# Patient Record
Sex: Female | Born: 1960 | Race: Black or African American | Hispanic: No | Marital: Single | Smoking: Never smoker
Health system: Southern US, Community
[De-identification: ages and names within clinical notes are randomized; demographics above are authoritative.]

## PROBLEM LIST (undated history)

## (undated) DIAGNOSIS — E119 Type 2 diabetes mellitus without complications: Secondary | ICD-10-CM

## (undated) DIAGNOSIS — E079 Disorder of thyroid, unspecified: Secondary | ICD-10-CM

## (undated) DIAGNOSIS — E785 Hyperlipidemia, unspecified: Secondary | ICD-10-CM

## (undated) DIAGNOSIS — I10 Essential (primary) hypertension: Secondary | ICD-10-CM

## (undated) DIAGNOSIS — G43909 Migraine, unspecified, not intractable, without status migrainosus: Secondary | ICD-10-CM

## (undated) HISTORY — DX: Essential (primary) hypertension: I10

## (undated) HISTORY — DX: Type 2 diabetes mellitus without complications: E11.9

## (undated) HISTORY — PX: ABDOMINAL HYSTERECTOMY: SHX81

## (undated) HISTORY — DX: Disorder of thyroid, unspecified: E07.9

## (undated) HISTORY — DX: Hyperlipidemia, unspecified: E78.5

## (undated) HISTORY — DX: Migraine, unspecified, not intractable, without status migrainosus: G43.909

---

## 2012-10-16 ENCOUNTER — Ambulatory Visit: Payer: Self-pay | Admitting: Internal Medicine

## 2012-12-03 ENCOUNTER — Other Ambulatory Visit: Payer: Self-pay | Admitting: Nephrology

## 2012-12-03 LAB — CBC WITH DIFFERENTIAL/PLATELET
Basophil #: 0 10*3/uL (ref 0.0–0.1)
Basophil %: 0.5 %
Eosinophil #: 0.2 10*3/uL (ref 0.0–0.7)
MCHC: 34.2 g/dL (ref 32.0–36.0)
MCV: 88 fL (ref 80–100)
Monocyte #: 0.5 x10 3/mm (ref 0.2–0.9)
Monocyte %: 7.8 %
Neutrophil %: 49.1 %
Platelet: 320 10*3/uL (ref 150–440)
RBC: 3.9 10*6/uL (ref 3.80–5.20)
RDW: 13.2 % (ref 11.5–14.5)
WBC: 6.2 10*3/uL (ref 3.6–11.0)

## 2012-12-03 LAB — COMPREHENSIVE METABOLIC PANEL
Albumin: 3.5 g/dL (ref 3.4–5.0)
Alkaline Phosphatase: 85 U/L (ref 50–136)
Calcium, Total: 9 mg/dL (ref 8.5–10.1)
Chloride: 104 mmol/L (ref 98–107)
Creatinine: 0.67 mg/dL (ref 0.60–1.30)
EGFR (African American): >60
Glucose: 103 mg/dL — ABNORMAL HIGH (ref 65–99)
SGOT(AST): 20 U/L (ref 15–37)
SGPT (ALT): 29 U/L (ref 12–78)
Sodium: 137 mmol/L (ref 136–145)
Total Protein: 7.6 g/dL (ref 6.4–8.2)

## 2012-12-03 LAB — URINALYSIS, COMPLETE
Bacteria: NONE SEEN
Bilirubin,UR: NEGATIVE
Blood: NEGATIVE
Glucose,UR: NEGATIVE mg/dL (ref 0–75)
Ketone: NEGATIVE
Squamous Epithelial: NONE SEEN
WBC UR: 4 /HPF (ref 0–5)

## 2012-12-03 LAB — PROTEIN / CREATININE RATIO, URINE
Creatinine, Urine: 70.7 mg/dL (ref 30.0–125.0)
Protein/Creat. Ratio: 495 mg/gCREAT — ABNORMAL HIGH (ref 0–200)

## 2012-12-03 LAB — PROTIME-INR: Prothrombin Time: 12.6 secs (ref 11.5–14.7)

## 2012-12-03 LAB — APTT: Activated PTT: 27.3 secs (ref 23.6–35.9)

## 2012-12-04 ENCOUNTER — Observation Stay: Payer: Self-pay | Admitting: Nephrology

## 2012-12-04 LAB — URINALYSIS, COMPLETE
Glucose,UR: NEGATIVE mg/dL (ref 0–75)
Ketone: NEGATIVE
Leukocyte Esterase: NEGATIVE
Nitrite: NEGATIVE
Ph: 7 (ref 4.5–8.0)
Protein: 100
Specific Gravity: 1.014 (ref 1.003–1.030)

## 2012-12-04 LAB — PROTEIN / CREATININE RATIO, URINE
Creatinine, Urine: 85.2 mg/dL (ref 30.0–125.0)
Protein/Creat. Ratio: 610 mg/gCREAT — ABNORMAL HIGH (ref 0–200)

## 2013-10-21 ENCOUNTER — Ambulatory Visit: Payer: Self-pay | Admitting: Nurse Practitioner

## 2015-06-08 ENCOUNTER — Other Ambulatory Visit: Payer: Self-pay | Admitting: Nurse Practitioner

## 2015-06-08 DIAGNOSIS — Z1231 Encounter for screening mammogram for malignant neoplasm of breast: Secondary | ICD-10-CM

## 2015-06-18 ENCOUNTER — Ambulatory Visit: Payer: Self-pay

## 2015-06-24 ENCOUNTER — Ambulatory Visit
Admission: RE | Admit: 2015-06-24 | Discharge: 2015-06-24 | Disposition: A | Discharge status: Home / Self Care | Source: Ambulatory Visit | Attending: Nurse Practitioner | Admitting: Nurse Practitioner

## 2015-06-24 DIAGNOSIS — Z1231 Encounter for screening mammogram for malignant neoplasm of breast: Secondary | ICD-10-CM | POA: Diagnosis not present

## 2015-07-01 ENCOUNTER — Other Ambulatory Visit: Payer: Self-pay | Admitting: Nurse Practitioner

## 2015-07-01 DIAGNOSIS — N63 Unspecified lump in unspecified breast: Secondary | ICD-10-CM

## 2015-07-02 ENCOUNTER — Ambulatory Visit
Admission: RE | Admit: 2015-07-02 | Discharge: 2015-07-02 | Disposition: A | Discharge status: Home / Self Care | Source: Ambulatory Visit | Attending: Nurse Practitioner | Admitting: Nurse Practitioner

## 2015-07-02 DIAGNOSIS — N63 Unspecified lump in unspecified breast: Secondary | ICD-10-CM

## 2016-04-21 ENCOUNTER — Other Ambulatory Visit: Payer: Self-pay | Admitting: Nurse Practitioner

## 2016-04-21 DIAGNOSIS — Z1231 Encounter for screening mammogram for malignant neoplasm of breast: Secondary | ICD-10-CM

## 2016-06-24 ENCOUNTER — Ambulatory Visit
Admission: RE | Admit: 2016-06-24 | Discharge: 2016-06-24 | Disposition: A | Discharge status: Home / Self Care | Payer: 59 | Source: Ambulatory Visit | Attending: Nurse Practitioner | Admitting: Nurse Practitioner

## 2016-06-24 DIAGNOSIS — Z1231 Encounter for screening mammogram for malignant neoplasm of breast: Secondary | ICD-10-CM | POA: Insufficient documentation

## 2017-05-30 ENCOUNTER — Other Ambulatory Visit: Payer: Self-pay | Admitting: Nurse Practitioner

## 2017-05-30 DIAGNOSIS — Z1231 Encounter for screening mammogram for malignant neoplasm of breast: Secondary | ICD-10-CM

## 2017-06-26 ENCOUNTER — Ambulatory Visit
Admission: RE | Admit: 2017-06-26 | Discharge: 2017-06-26 | Disposition: A | Discharge status: Home / Self Care | Payer: 59 | Source: Ambulatory Visit | Attending: Nurse Practitioner | Admitting: Nurse Practitioner

## 2017-06-26 DIAGNOSIS — Z1231 Encounter for screening mammogram for malignant neoplasm of breast: Secondary | ICD-10-CM | POA: Diagnosis present

## 2018-06-18 ENCOUNTER — Other Ambulatory Visit: Payer: Self-pay | Admitting: Nurse Practitioner

## 2018-06-18 DIAGNOSIS — Z1231 Encounter for screening mammogram for malignant neoplasm of breast: Secondary | ICD-10-CM

## 2018-06-29 ENCOUNTER — Other Ambulatory Visit: Payer: Self-pay

## 2018-06-29 ENCOUNTER — Ambulatory Visit
Admission: RE | Admit: 2018-06-29 | Discharge: 2018-06-29 | Disposition: A | Discharge status: Home / Self Care | Payer: 59 | Source: Ambulatory Visit | Attending: Nurse Practitioner | Admitting: Nurse Practitioner

## 2018-06-29 DIAGNOSIS — Z1231 Encounter for screening mammogram for malignant neoplasm of breast: Secondary | ICD-10-CM | POA: Diagnosis not present

## 2018-07-03 ENCOUNTER — Other Ambulatory Visit: Payer: Self-pay | Admitting: Nurse Practitioner

## 2018-07-03 DIAGNOSIS — R928 Other abnormal and inconclusive findings on diagnostic imaging of breast: Secondary | ICD-10-CM

## 2018-07-03 DIAGNOSIS — N6489 Other specified disorders of breast: Secondary | ICD-10-CM

## 2018-07-09 ENCOUNTER — Ambulatory Visit
Admission: RE | Admit: 2018-07-09 | Discharge: 2018-07-09 | Disposition: A | Discharge status: Home / Self Care | Payer: 59 | Source: Ambulatory Visit | Attending: Nurse Practitioner | Admitting: Nurse Practitioner

## 2018-07-09 ENCOUNTER — Other Ambulatory Visit: Payer: Self-pay

## 2018-07-09 DIAGNOSIS — R928 Other abnormal and inconclusive findings on diagnostic imaging of breast: Secondary | ICD-10-CM

## 2018-07-09 DIAGNOSIS — N6489 Other specified disorders of breast: Secondary | ICD-10-CM | POA: Diagnosis present

## 2019-02-11 ENCOUNTER — Other Ambulatory Visit: Payer: Self-pay

## 2019-02-11 DIAGNOSIS — Z20822 Contact with and (suspected) exposure to covid-19: Secondary | ICD-10-CM

## 2019-02-12 LAB — NOVEL CORONAVIRUS, NAA: SARS-CoV-2, NAA: NOT DETECTED

## 2019-04-01 ENCOUNTER — Ambulatory Visit: Admitting: *Deleted

## 2019-04-02 ENCOUNTER — Encounter: Payer: Self-pay | Admitting: *Deleted

## 2019-04-02 ENCOUNTER — Other Ambulatory Visit: Payer: Self-pay

## 2019-04-02 ENCOUNTER — Encounter: Payer: 59 | Attending: Nurse Practitioner | Admitting: *Deleted

## 2019-04-02 VITALS — BP 112/76 | Ht 66.0 in | Wt 174.1 lb

## 2019-04-02 DIAGNOSIS — Z713 Dietary counseling and surveillance: Secondary | ICD-10-CM | POA: Diagnosis not present

## 2019-04-02 DIAGNOSIS — E119 Type 2 diabetes mellitus without complications: Secondary | ICD-10-CM | POA: Insufficient documentation

## 2019-04-02 NOTE — Patient Instructions (Addendum)
Exercise: Continue walking/cardio for  30  minutes  2 days a week and gradually increase to 30 minutes 5 x week  Eat 3 meals day, 1-2 snacks a day Space meals 4-6 hours apart Allow 2-3 hours between meals and snacks Include 1 protein serving with meals  Return for classes on:

## 2019-04-03 ENCOUNTER — Encounter: Payer: Self-pay | Admitting: *Deleted

## 2019-04-03 NOTE — Progress Notes (Signed)
Diabetes Self-Management Education  Visit Type: First/Initial  Appt. Start Time: 1540 Appt. End Time: 1650  04/02/2019  Ms. Lauren Rodriguez, identified by name and date of birth, is a 58 y.o. female with a diagnosis of Diabetes: Type 2.   ASSESSMENT  Blood pressure 112/76, height 5\' 6"  (1.676 m), weight 174 lb 1.6 oz (79 kg). Body mass index is 28.1 kg/m.  Diabetes Self-Management Education - 04/02/19 1701      Visit Information   Visit Type  First/Initial      Initial Visit   Diabetes Type  Type 2    Are you currently following a meal plan?  Yes    What type of meal plan do you follow?  "no sodas or sugary drinks, no fried, limit pasta, don't eat after 7:30 pm"    Are you taking your medications as prescribed?  Yes    Date Diagnosed  2 years ago      Health Coping   How would you rate your overall health?  Good      Psychosocial Assessment   Patient Belief/Attitude about Diabetes  Motivated to manage diabetes   "sad, disappointed"   Self-care barriers  None    Self-management support  Doctor's office    Patient Concerns  Nutrition/Meal planning;Glycemic Control;Weight Control;Medication;Monitoring;Healthy Lifestyle    Special Needs  None    Preferred Learning Style  Hands on    Learning Readiness  Change in progress    How often do you need to have someone help you when you read instructions, pamphlets, or other written materials from your doctor or pharmacy?  1 - Never    What is the last grade level you completed in school?  15 years      Pre-Education Assessment   Patient understands the diabetes disease and treatment process.  Needs Instruction    Patient understands incorporating nutritional management into lifestyle.  Needs Instruction    Patient undertands incorporating physical activity into lifestyle.  Needs Instruction    Patient understands using medications safely.  Needs Instruction    Patient understands monitoring blood glucose, interpreting and using  results  Needs Instruction    Patient understands prevention, detection, and treatment of acute complications.  Needs Instruction    Patient understands prevention, detection, and treatment of chronic complications.  Needs Instruction    Patient understands how to develop strategies to address psychosocial issues.  Needs Instruction    Patient understands how to develop strategies to promote health/change behavior.  Needs Instruction      Complications   Last HgB A1C per patient/outside source  6.4 %   02/25/2019   How often do you check your blood sugar?  Patient declines    Have you had a dilated eye exam in the past 12 months?  Yes    Have you had a dental exam in the past 12 months?  Yes    Are you checking your feet?  Yes    How many days per week are you checking your feet?  7      Dietary Intake   Breakfast  wheat toast with cheese and avocado; smoothie 3 x week with spinach, kale, ginger, chia seeds, blueberries, coconut yogurt, water, almond milk; eggs, spinach and cheese; pancakes 1 x week    Snack (morning)  fruit (grapes, strawberries, blueberries, apple)    Lunch  spaghetti, smoothie with avocado; chicken and veggies; left overs    Snack (afternoon)  fruit (orange), peanut ubtter sandwich  Dinner  chicken, fish, [place]; sweet potatoes, green beans, peas, beans, quinoa, rice, pasta, salad with lettuce, onions, cukes, cabbage, red wine vinegar dressing    Beverage(s)  water, unsweetened green tea      Exercise   Exercise Type  Moderate (swimming / aerobic walking)    How many days per week to you exercise?  2    How many minutes per day do you exercise?  30    Total minutes per week of exercise  60      Patient Education   Previous Diabetes Education  No    Disease state   Definition of diabetes, type 1 and 2, and the diagnosis of diabetes;Factors that contribute to the development of diabetes    Nutrition management   Role of diet in the treatment of diabetes and the  relationship between the three main macronutrients and blood glucose level;Food label reading, portion sizes and measuring food.    Physical activity and exercise   Role of exercise on diabetes management, blood pressure control and cardiac health.    Medications  Reviewed patients medication for diabetes, action, purpose, timing of dose and side effects.    Monitoring  Interpreting lab values - A1C, lipid, urine microalbumina.    Chronic complications  Relationship between chronic complications and blood glucose control    Psychosocial adjustment  Identified and addressed patients feelings and concerns about diabetes      Individualized Goals (developed by patient)   Reducing Risk  Improve blood sugars Decrease medications Prevent diabetes complications Lose weight Lead a healthier lifestyle Become more fit     Outcomes   Expected Outcomes  Demonstrated interest in learning. Expect positive outcomes    Future DMSE  4-6 wks       Individualized Plan for Diabetes Self-Management Training:   Learning Objective:  Patient will have a greater understanding of diabetes self-management. Patient education plan is to attend individual and/or group sessions per assessed needs and concerns.   Plan:   Patient Instructions  Exercise: Continue walking/cardio for  30  minutes  2 days a week and gradually increase to 30 minutes 5 x week Eat 3 meals day, 1-2 snacks a day Space meals 4-6 hours apart Allow 2-3 hours between meals and snacks Include 1 protein serving with meals  Expected Outcomes:  Demonstrated interest in learning. Expect positive outcomes  Education material provided:  General Meal Planning Guidelines Simple Meal Plan  If problems or questions, patient to contact team via:  Lauren Rodriguez, [place], [place], CDE [phone]  Future DSME appointment: 4-6 wks  March 21, 2020 for Diabetes Class 1

## 2019-04-09 ENCOUNTER — Ambulatory Visit: Attending: Internal Medicine

## 2019-04-09 DIAGNOSIS — Z20822 Contact with and (suspected) exposure to covid-19: Secondary | ICD-10-CM

## 2019-04-11 LAB — NOVEL CORONAVIRUS, NAA: SARS-CoV-2, NAA: NOT DETECTED

## 2019-04-22 ENCOUNTER — Other Ambulatory Visit: Payer: Self-pay

## 2019-04-22 ENCOUNTER — Encounter: Payer: 59 | Attending: Nurse Practitioner | Admitting: Dietician

## 2019-04-22 ENCOUNTER — Encounter: Payer: Self-pay | Admitting: Dietician

## 2019-04-22 VITALS — Ht 66.0 in | Wt 175.5 lb

## 2019-04-22 DIAGNOSIS — Z713 Dietary counseling and surveillance: Secondary | ICD-10-CM | POA: Diagnosis present

## 2019-04-22 DIAGNOSIS — E119 Type 2 diabetes mellitus without complications: Secondary | ICD-10-CM | POA: Diagnosis not present

## 2019-04-22 NOTE — Progress Notes (Signed)

## 2019-04-29 ENCOUNTER — Other Ambulatory Visit: Payer: Self-pay

## 2019-04-29 ENCOUNTER — Encounter: Payer: 59 | Admitting: *Deleted

## 2019-04-29 ENCOUNTER — Encounter: Payer: Self-pay | Admitting: *Deleted

## 2019-04-29 VITALS — Wt 175.0 lb

## 2019-04-29 DIAGNOSIS — E119 Type 2 diabetes mellitus without complications: Secondary | ICD-10-CM

## 2019-04-29 DIAGNOSIS — Z713 Dietary counseling and surveillance: Secondary | ICD-10-CM | POA: Diagnosis not present

## 2019-04-29 NOTE — Progress Notes (Signed)

## 2019-05-06 ENCOUNTER — Ambulatory Visit

## 2019-05-06 ENCOUNTER — Telehealth: Payer: Self-pay | Admitting: *Deleted

## 2019-05-06 NOTE — Telephone Encounter (Signed)
Received voice mail that patient would not be able to come to Class 3 tonight. She is picking her [place] at the airport at 4 pm and would not be able to make it on time. Left a message that the next Class 3 would be Feb 15 on Mon night from 5:30-8:30 pm and to call and confirm.

## 2019-05-06 NOTE — Telephone Encounter (Signed)
Patient left voice mail to confirm make up date  for tonight's Class 3. Rescheduled for Feb 15.

## 2019-06-03 ENCOUNTER — Encounter: Payer: Self-pay | Admitting: *Deleted

## 2019-06-03 ENCOUNTER — Other Ambulatory Visit: Payer: Self-pay

## 2019-06-03 ENCOUNTER — Encounter: Payer: 59 | Attending: Nurse Practitioner | Admitting: *Deleted

## 2019-06-03 VITALS — BP 104/70 | Wt 172.9 lb

## 2019-06-03 DIAGNOSIS — E119 Type 2 diabetes mellitus without complications: Secondary | ICD-10-CM | POA: Insufficient documentation

## 2019-06-03 DIAGNOSIS — Z713 Dietary counseling and surveillance: Secondary | ICD-10-CM | POA: Insufficient documentation

## 2019-06-03 NOTE — Progress Notes (Signed)
Appt. Start Time: 1730 Appt. End Time: 2000 ? ?Class 3 ?Diabetes Overview - identify functions of pancreas and insulin; define insulin deficiency vs insulin resistance ? ?Medications - state name, dose, timing of currently prescribed medications; describe types of medications available for diabetes ? ?Psychosocial - identify DM as a source of stress; state the effects of stress on BG control; verbalize appropriate stress management techniques; identify personal stress issues  ? ?Nutritional Management - use food labels to identify serving size, content of carbohydrate, fiber, protein, fat, saturated fat and sodium; recognize food sources of fat, saturated fat, trans fat, and sodium, and verbalize goals for intake; describe healthful, appropriate food choices when dining out  ? ?Exercise - state a plan for personal exercise; verbalize contraindications for exercise ? ?Self-Monitoring - state importance of SMBG; use SMBG results to effectively manage diabetes; identify importance of regular HbA1C testing and goals for results ? ?Acute Complications - recognize hyperglycemia and hypoglycemia with causes and effects; identify blood glucose results as high, low or in control; list steps in treating and preventing high and low blood glucose ? ?Chronic Complications - state importance of daily self-foot exams; explain appropriate eye and dental care ? ?Lifestyle Changes/Goals & Health/Community Resources - set goals for proper diabetes care; state need for and frequency of healthcare follow-up; describe appropriate community resources for good health (ADA, web sites, apps)  ? ?Teaching Materials Used: ?Class 3 Slide Packet ?Diabetes Stress Test ?Stress Management Tools ?Stress Poem ?Goal Setting Worksheet ?Website/App List ? ?  ?

## 2019-06-04 ENCOUNTER — Encounter: Payer: Self-pay | Admitting: *Deleted

## 2019-06-28 ENCOUNTER — Other Ambulatory Visit: Payer: Self-pay | Admitting: Nurse Practitioner

## 2019-06-28 DIAGNOSIS — Z1231 Encounter for screening mammogram for malignant neoplasm of breast: Secondary | ICD-10-CM

## 2019-07-12 ENCOUNTER — Ambulatory Visit

## 2019-07-16 ENCOUNTER — Ambulatory Visit
Admission: RE | Admit: 2019-07-16 | Discharge: 2019-07-16 | Disposition: A | Discharge status: Home / Self Care | Payer: 59 | Source: Ambulatory Visit | Attending: Nurse Practitioner | Admitting: Nurse Practitioner

## 2019-07-16 DIAGNOSIS — Z1231 Encounter for screening mammogram for malignant neoplasm of breast: Secondary | ICD-10-CM | POA: Diagnosis not present

## 2019-07-18 ENCOUNTER — Ambulatory Visit: Attending: Internal Medicine

## 2019-07-18 DIAGNOSIS — Z23 Encounter for immunization: Secondary | ICD-10-CM

## 2019-07-18 NOTE — Progress Notes (Signed)
   Covid-19 Vaccination Clinic  Name:  Lauren Rodriguez    MRN: [id number] DOB: 17-Jun-1960  07/18/2019  Lauren Rodriguez was observed post Covid-19 immunization for 15 minutes without incident. She was provided with Vaccine Information Sheet and instruction to access the V-Safe system.   Lauren Rodriguez was instructed to call 911 with any severe reactions post vaccine: Marland Kitchen Difficulty breathing  . Swelling of face and throat  . A fast heartbeat  . A bad rash all over body  . Dizziness and weakness   Immunizations Administered    Name Date Dose VIS Date Route   Pfizer COVID-19 Vaccine 07/18/2019  8:22 AM 0.3 mL 03/29/2019 Intramuscular   Manufacturer: [employer], [employer]   Lot: [id number]   NDC: [address]-5

## 2019-07-22 ENCOUNTER — Other Ambulatory Visit: Payer: Self-pay | Admitting: Nurse Practitioner

## 2019-07-22 DIAGNOSIS — R928 Other abnormal and inconclusive findings on diagnostic imaging of breast: Secondary | ICD-10-CM

## 2019-07-22 DIAGNOSIS — N6489 Other specified disorders of breast: Secondary | ICD-10-CM

## 2019-08-09 ENCOUNTER — Ambulatory Visit
Admission: RE | Admit: 2019-08-09 | Discharge: 2019-08-09 | Disposition: A | Discharge status: Home / Self Care | Payer: 59 | Source: Ambulatory Visit | Attending: Nurse Practitioner | Admitting: Nurse Practitioner

## 2019-08-09 DIAGNOSIS — N6489 Other specified disorders of breast: Secondary | ICD-10-CM

## 2019-08-09 DIAGNOSIS — R928 Other abnormal and inconclusive findings on diagnostic imaging of breast: Secondary | ICD-10-CM | POA: Insufficient documentation

## 2019-08-12 ENCOUNTER — Ambulatory Visit: Admitting: Dermatology

## 2019-08-13 ENCOUNTER — Ambulatory Visit: Attending: Internal Medicine

## 2019-08-13 DIAGNOSIS — Z23 Encounter for immunization: Secondary | ICD-10-CM

## 2019-08-13 NOTE — Progress Notes (Signed)
   Covid-19 Vaccination Clinic  Name:  Lauren Rodriguez    MRN: [id number] DOB: May 14, 1960  08/13/2019  Lauren Rodriguez was observed post Covid-19 immunization for 15 minutes without incident. She was provided with Vaccine Information Sheet and instruction to access the V-Safe system.   Lauren Rodriguez was instructed to call 911 with any severe reactions post vaccine: Marland Kitchen Difficulty breathing  . Swelling of face and throat  . A fast heartbeat  . A bad rash all over body  . Dizziness and weakness   Immunizations Administered    Name Date Dose VIS Date Route   Pfizer COVID-19 Vaccine 08/13/2019  8:44 AM 0.3 mL 06/12/2018 Intramuscular   Manufacturer: [employer], [employer]   Lot: [id number]   NDC: [address]-8

## 2020-09-29 ENCOUNTER — Other Ambulatory Visit: Payer: Self-pay | Admitting: Nurse Practitioner

## 2020-09-29 DIAGNOSIS — Z1231 Encounter for screening mammogram for malignant neoplasm of breast: Secondary | ICD-10-CM

## 2020-10-06 ENCOUNTER — Ambulatory Visit
Admission: RE | Admit: 2020-10-06 | Discharge: 2020-10-06 | Disposition: A | Discharge status: Home / Self Care | Payer: Managed Care, Other (non HMO) | Source: Ambulatory Visit | Attending: Nurse Practitioner | Admitting: Nurse Practitioner

## 2020-10-06 ENCOUNTER — Other Ambulatory Visit: Payer: Self-pay

## 2020-10-06 DIAGNOSIS — Z1231 Encounter for screening mammogram for malignant neoplasm of breast: Secondary | ICD-10-CM | POA: Diagnosis present

## 2021-07-14 ENCOUNTER — Ambulatory Visit (INDEPENDENT_AMBULATORY_CARE_PROVIDER_SITE_OTHER): Payer: Managed Care, Other (non HMO) | Admitting: Psychiatry

## 2021-07-14 ENCOUNTER — Encounter: Payer: Self-pay | Admitting: Psychiatry

## 2021-07-14 VITALS — BP 118/76 | HR 74 | Ht 66.0 in | Wt 172.0 lb

## 2021-07-14 DIAGNOSIS — R202 Paresthesia of skin: Secondary | ICD-10-CM | POA: Diagnosis not present

## 2021-07-14 DIAGNOSIS — R200 Anesthesia of skin: Secondary | ICD-10-CM | POA: Diagnosis not present

## 2021-07-14 DIAGNOSIS — G43019 Migraine without aura, intractable, without status migrainosus: Secondary | ICD-10-CM | POA: Diagnosis not present

## 2021-07-14 MED ORDER — EMGALITY 120 MG/ML ~~LOC~~ SOAJ: 1.0000 "pen " | SUBCUTANEOUS | 3 refills | Status: DC

## 2021-07-14 MED ORDER — EMGALITY 120 MG/ML ~~LOC~~ SOAJ: 2.0000 "pen " | Freq: Once | SUBCUTANEOUS | 0 refills | Status: AC

## 2021-07-14 MED ORDER — UBRELVY 100 MG PO TABS: 100.0000 mg | ORAL_TABLET | ORAL | 3 refills | Status: DC | PRN

## 2021-07-14 MED ORDER — UBRELVY 100 MG PO TABS: 100.0000 mg | ORAL_TABLET | ORAL | 0 refills | Status: DC | PRN

## 2021-07-14 NOTE — Progress Notes (Signed)
? ?Referring:  ?Danelle Berry, NP ?[facility] ?[place],  [place] [address] ? ?PCP: ?Danelle Berry, NP ? ?Neurology was asked to evaluate Lauren Rodriguez, a 61 year old female for a chief complaint of headaches.  Our recommendations of care will be communicated by shared medical record.   ? ?CC:  headaches ? ?History provided from self ? ?HPI:  ?Medical co-morbidities: DM, hypothyroidism, proteinuria ? ?The patient presents for evaluation of migraines which began 20 years ago when she was in the [employer]. She has a lower level headache every day, but has more severe migraines 3 times per month. Migraine can last for 3 days at a time. They are described as pounding pain with associated photophobia, phonophobia, and nausea. She also endorses significant neck pain. ? ?Her migraines were previously well-controlled with Emgality for prevention and Ubrelvy for rescue. Insurance initially covered this, but has now stopped covering the medications. She has tried taking Ubrelvy samples while off of Emgality, which was not effective.  ? ?Headache History: ?Onset: 20 years ago ?Triggers: strong smells, stress, bright lights ?Aura: none ?Location: varies ?Quality/Description: pounding ?Associated Symptoms: ? Photophobia: yes ? Phonophobia: yes ? Nausea: yes ?Worse with activity?: yes ?Duration of headaches: up to 3 days ? ?Migraine days per month: 12 ?Migraine free days per month: 18 ? ?Current Treatment: ?Abortive ?Ubrelvy 50 mg (samples) ? ?Preventative ?none ? ?Prior Therapies                                 ?Ubrelvy 100 mg PRN ?Citalopram  ?Propranolol ?amitriptyline ?Lisinopril 5 mg daily ?Fioricet ?Emgality 120 mg monthly ?Imitrex ?Maxalt ?Zomig ?Trigger point injections ? ?LABS: ?CBC ?   ?Component Value Date/Time  ? WBC 6.2 12/03/2012 1123  ? RBC 3.90 12/03/2012 1123  ? HGB 11.2 (L) 12/04/2012 1612  ? HCT 34.3 (L) 12/03/2012 1123  ? PLT 320 12/03/2012 1123  ? MCV 88 12/03/2012 1123  ? MCH 30.0 12/03/2012 1123   ? MCHC 34.2 12/03/2012 1123  ? RDW 13.2 12/03/2012 1123  ? LYMPHSABS 2.4 12/03/2012 1123  ? MONOABS 0.5 12/03/2012 1123  ? EOSABS 0.2 12/03/2012 1123  ? BASOSABS 0.0 12/03/2012 1123  ? ? ?  Latest Ref Rng & Units 12/03/2012  ? 11:23 AM  ?CMP  ?Glucose 65 - 99 mg/dL 103    ?BUN 7 - 18 mg/dL 16    ?Creatinine 0.60 - 1.30 mg/dL 0.67    ?Sodium 136 - 145 mmol/L 137    ?Potassium 3.5 - 5.1 mmol/L 4.0    ?Chloride 98 - 107 mmol/L 104    ?CO2 21 - 32 mmol/L 31    ?Calcium 8.5 - 10.1 mg/dL 9.0    ?Total Protein 6.4 - 8.2 g/dL 7.6    ?Total Bilirubin 0.2 - 1.0 mg/dL 0.3    ?Alkaline Phos 50 - 136 Unit/L 85    ?AST 15 - 37 Unit/L 20    ?ALT 12 - 78 U/L 29    ? ? ? ?IMAGING:  ?none ? ?Current Outpatient Medications on File Prior to Visit  ?Medication Sig Dispense Refill  ? butalbital-acetaminophen-caffeine (FIORICET) 50-325-40 MG tablet Take 1 tablet by mouth every 8 (eight) hours as needed.    ? citalopram (CELEXA) 10 MG tablet Take 10 mg by mouth daily.    ? empagliflozin (JARDIANCE) 25 MG TABS tablet Take 25 mg by mouth daily.    ? HYDROcodone-acetaminophen (NORCO/VICODIN) 5-325 MG  tablet Take one tablet at night for pain; may take up to every 6 hours as needed for pain if not working or driving    ? levothyroxine (SYNTHROID) 100 MCG tablet Take 100 mcg by mouth every morning.    ? lisinopril (ZESTRIL) 5 MG tablet Take 5 mg by mouth daily.    ? naproxen (NAPROSYN) 500 MG tablet Take 500 mg by mouth 2 (two) times daily.    ? pantoprazole (PROTONIX) 40 MG tablet Take 40 mg by mouth daily.    ? rosuvastatin (CRESTOR) 40 MG tablet Take 40 mg by mouth daily.    ? ?No current facility-administered medications on file prior to visit.  ? ? ? ?Allergies: ?Allergies  ?Allergen Reactions  ? Other Hives and Swelling  ?  Chocolate flavoring  ? ? ?Family History: ?Migraine or other headaches in the family:  no ?Aneurysms in a first degree relative:  no ?Brain tumors in the family:  no ?Other neurological illness in the family:    no ? ?Past Medical History: ?Past Medical History:  ?Diagnosis Date  ? Diabetes mellitus without complication ([place])   ? Hyperlipidemia   ? Hypertension   ? Migraines   ? Thyroid disease   ? ? ?Past Surgical History ?Past Surgical History:  ?Procedure Laterality Date  ? ABDOMINAL HYSTERECTOMY    ? ? ?Social History: ?Social History  ? ?Tobacco Use  ? Smoking status: Never  ? Smokeless tobacco: Never  ?Substance Use Topics  ? Alcohol use: Yes  ?  Alcohol/week: 2.0 standard drinks  ?  Types: 2 Glasses of wine per week  ? ? ?ROS: ?Negative for fevers, chills. Positive for migraines. All other systems reviewed and negative unless stated otherwise in HPI. ? ? ?Physical Exam:  ? ?Vital Signs: ?BP 118/76   Pulse 74   Ht 5\' 6"  (1.676 m)   Wt 172 lb (78 kg)   BMI 27.76 kg/m?  ?GENERAL: well appearing,in no acute distress,alert ?SKIN:  Color, texture, turgor normal. No rashes or lesions ?HEAD:  Normocephalic/atraumatic. ?CV:  RRR ?RESP: Normal respiratory effort ?MSK: +mild tenderness to palpation over bilateral occiput, neck, and shoulders ? ?NEUROLOGICAL: ?Mental Status: Alert, oriented to person, place and time,Follows commands ?Cranial Nerves: PERRL, visual fields intact to confrontation, extraocular movements intact, decreased sensation over right V1, asymmetric smile (right corner of mouth lower than left--patient does report she is wearing a new retainer), hearing grossly intact, no dysarthria ?Motor: muscle strength 5/5 both upper and lower extremities,no drift, normal tone ?Reflexes: 2+ throughout ?Sensation: intact to light touch all 4 extremities ?Coordination: Finger-to- nose-finger intact bilaterally ?Gait: normal-based ? ? ?IMPRESSION: ?61 year old female with a history of DM, hypothyroidism, proteinuria who presents for evaluation of migraines. Will order MRI brain as her exam is significant for asymmetric smile and decreased sensation over right V1.Migraines were previously controlled with Emgality for  prevention and Ubrelvy for rescue. She has failed multiple oral medications previously including triptans, beta blockers, anti-depressants, and anti-hypertensives. Will restart Emgality and Ubrelvy. Provided website links to savings programs if we are unable to obtain insurance coverage. ? ?PLAN: ?-MRI brain without contrast (hx kidney disease with proteinuria) ?-Prevention: Restart Emgality 120 mg monthly ?-Rescue: Restart Ubrelvy 100 mg PRN ? ?I spent a total of 33 minutes chart reviewing and counseling the patient. Headache education was done. Discussed treatment options including preventive and acute medications. Discussed medication side effects, adverse reactions and drug interactions. Written educational materials and patient instructions outlining all of the  above were given. ? ?Follow-up: 4 months ? ? ?Genia Harold, MD ?07/14/2021   ?12:02 PM ? ? ?

## 2021-07-14 NOTE — Patient Instructions (Addendum)
Websites for savings cards: ?[email or web address] ?[email or web address] ? ?MRI of the brain ?

## 2021-07-20 ENCOUNTER — Telehealth: Payer: Self-pay | Admitting: Psychiatry

## 2021-07-20 NOTE — Telephone Encounter (Signed)
Cigna/Tricare order sent to GI, no auth req per Gwinda Passe at [facility] for [facility]. They will obtain the auth for Cigna and reach out to the patient to schedule.  ?

## 2021-07-22 ENCOUNTER — Ambulatory Visit
Admission: RE | Admit: 2021-07-22 | Discharge: 2021-07-22 | Disposition: A | Discharge status: Home / Self Care | Source: Ambulatory Visit | Attending: Psychiatry | Admitting: Psychiatry

## 2021-07-22 DIAGNOSIS — R200 Anesthesia of skin: Secondary | ICD-10-CM | POA: Diagnosis not present

## 2021-07-22 DIAGNOSIS — R202 Paresthesia of skin: Secondary | ICD-10-CM

## 2021-07-28 ENCOUNTER — Encounter: Payer: Self-pay | Admitting: *Deleted

## 2021-07-28 ENCOUNTER — Telehealth: Payer: Self-pay | Admitting: *Deleted

## 2021-07-28 ENCOUNTER — Encounter: Payer: Self-pay | Admitting: Psychiatry

## 2021-07-28 NOTE — Telephone Encounter (Signed)
New PA started on CMM, Your information has been sent to [facility]. ?

## 2021-07-28 NOTE — Telephone Encounter (Signed)
Ubrelvy 100 mg PA, key [id number],  G43.019. ?Patient not found. [employer] customer service #, spoke with Diane who stated she doesn't have pharmacy benefit with [place]. She has pharmacy benefits with [facility], [phone]. She transferred call, spoke with Lucendia Herrlich and answered questions. She stated it qualifies for EPA through cover my meds, key [id number].  ? ?

## 2021-07-28 NOTE — Telephone Encounter (Signed)
Bernita Raisin, Case number: [id number] ?You can now fill your prescription for this medication. ?VALID 07/28/2021 - 07/29/2022 ?Sent my chart to inform patient. ?

## 2021-12-09 ENCOUNTER — Ambulatory Visit: Payer: Managed Care, Other (non HMO) | Admitting: Psychiatry

## 2021-12-09 ENCOUNTER — Other Ambulatory Visit: Payer: Self-pay | Admitting: Nurse Practitioner

## 2021-12-09 DIAGNOSIS — Z1231 Encounter for screening mammogram for malignant neoplasm of breast: Secondary | ICD-10-CM

## 2022-01-03 ENCOUNTER — Ambulatory Visit: Payer: Managed Care, Other (non HMO) | Admitting: Psychiatry

## 2022-01-03 VITALS — BP 113/68 | HR 79 | Ht 66.0 in | Wt 175.0 lb

## 2022-01-03 DIAGNOSIS — G43019 Migraine without aura, intractable, without status migrainosus: Secondary | ICD-10-CM | POA: Diagnosis not present

## 2022-01-03 MED ORDER — UBRELVY 100 MG PO TABS: 100.0000 mg | ORAL_TABLET | ORAL | 11 refills | Status: DC | PRN

## 2022-01-03 MED ORDER — EMGALITY 120 MG/ML ~~LOC~~ SOAJ: 1.0000 "pen " | SUBCUTANEOUS | 11 refills | Status: DC

## 2022-01-03 NOTE — Progress Notes (Signed)
   CC:  headaches  Follow-up Visit  Last visit: 07/14/21  Brief HPI: 61 year old female with a history of DM, hypothyroidism, proteinuria who follows in clinic for migraines.  At her last visit, brain MRI was ordered. She was started on Emgality for prevention and Ubrelvy for rescue.  Interval History: Headaches have improved since starting [place]. She is down to one migraine per month. Has not had any side effects since restarting the medications.  Brain MRI 07/22/21 showed chronic microvascular changes and was otherwise unremarkable  Headache days per month: 1 Headache free days per month: 29  Current Headache Regimen: Preventative: Emgality 120 mg monthly Abortive: Ubrelvy 100 mg PRN   Prior Therapies                                  Prevention: Citalopram  Propranolol amitriptyline Lisinopril 5 mg daily Emgality 120 mg monthly Trigger point injections  Rescue: Imitrex Maxalt Zomig Ubrelvy 100 mg PRN Fioricet   Physical Exam:   Vital Signs: BP 113/68   Pulse 79   Ht 5\' 6"  (1.676 m)   Wt 175 lb (79.4 kg)   BMI 28.25 kg/m  GENERAL:  well appearing, in no acute distress, alert  SKIN:  Color, texture, turgor normal. No rashes or lesions HEAD:  Normocephalic/atraumatic. RESP: normal respiratory effort MSK:  No gross joint deformities.   NEUROLOGICAL: Mental Status: Alert, oriented to person, place and time, Follows commands, and Speech fluent and appropriate. Cranial Nerves: PERRL, face symmetric, no dysarthria, hearing grossly intact Motor: moves all extremities equally Gait: normal-based.  IMPRESSION: 61 year old female with a history of DM, hypothyroidism, proteinuria who presents for follow up of migraines. MRI brain with chronic microvascular changes, more than typical for her age. Discussed importance of maintaining good control of her vascular risk factors. She has had significant improvement in her migraines since restarting Emgality  for prevention and Ubrelvy for rescue. Will continue current medication regimen for now.  PLAN: -Prevention: Continue Emgality 120 mg monthly -Rescue: Continue Ubrelvy 100 mg PRN   Follow-up: 7 months  I spent a total of 15 minutes on the date of the service. Headache education was done. Discussed treatment options including preventive and acute medications.  Discussed medication side effects, adverse reactions and drug interactions. Written educational materials and patient instructions outlining all of the above were given.  Genia Harold, MD 01/03/22 3:30 PM

## 2022-01-06 ENCOUNTER — Ambulatory Visit
Admission: RE | Admit: 2022-01-06 | Discharge: 2022-01-06 | Disposition: A | Discharge status: Home / Self Care | Payer: Managed Care, Other (non HMO) | Source: Ambulatory Visit | Attending: Nurse Practitioner | Admitting: Nurse Practitioner

## 2022-01-06 DIAGNOSIS — Z1231 Encounter for screening mammogram for malignant neoplasm of breast: Secondary | ICD-10-CM | POA: Diagnosis present

## 2022-05-03 ENCOUNTER — Ambulatory Visit: Payer: Managed Care, Other (non HMO) | Admitting: Psychiatry

## 2022-07-17 ENCOUNTER — Other Ambulatory Visit: Payer: Self-pay | Admitting: Nurse Practitioner

## 2022-07-20 NOTE — Progress Notes (Unsigned)
   CC:  headaches  Follow-up Visit  Last visit: 01/03/22  Brief HPI: 62 year old female with a history of DM, hypothyroidism, proteinuria who follows in clinic for migraines. Brain MRI 07/22/21 showed chronic microvascular changes and was otherwise unremarkable.   At her last visit she was continued on Emgality for migraine prevention and Ubrelvy for rescue. Interval History: ***   Headache days per month: *** Migraine days per month*** Headache free days per month: ***  Current Headache Regimen: Preventative: Emgality 120 mg monthly Abortive: Ubrelvy 100 mg PRN   Prior Therapies                                  Prevention: Citalopram  Propranolol amitriptyline Lisinopril 5 mg daily Emgality 120 mg monthly Trigger point injections   Rescue: Imitrex Maxalt Zomig Ubrelvy 100 mg PRN Fioricet  Physical Exam:   Vital Signs: There were no vitals taken for this visit. GENERAL:  well appearing, in no acute distress, alert  SKIN:  Color, texture, turgor normal. No rashes or lesions HEAD:  Normocephalic/atraumatic. RESP: normal respiratory effort MSK:  No gross joint deformities.   NEUROLOGICAL: Mental Status: Alert, oriented to person, place and time, Follows commands, and Speech fluent and appropriate. Cranial Nerves: PERRL, face symmetric, no dysarthria, hearing grossly intact Motor: moves all extremities equally Gait: normal-based.  IMPRESSION: ***  PLAN: ***   Follow-up: ***  I spent a total of *** minutes on the date of the service. Headache education was done. Discussed lifestyle modification including increased oral hydration, decreased caffeine, exercise and stress management. Discussed treatment options including preventive and acute medications, natural supplements, and infusion therapy. Discussed medication overuse headache and to limit use of acute treatments to no more than 2 days/week or 10 days/month. Discussed medication side effects, adverse  reactions and drug interactions. Written educational materials and patient instructions outlining all of the above were given.  Genia Harold, MD

## 2022-07-21 ENCOUNTER — Ambulatory Visit (INDEPENDENT_AMBULATORY_CARE_PROVIDER_SITE_OTHER): Payer: Managed Care, Other (non HMO) | Admitting: Psychiatry

## 2022-07-21 ENCOUNTER — Encounter: Payer: Self-pay | Admitting: Psychiatry

## 2022-07-21 VITALS — BP 125/76 | HR 90 | Ht 66.0 in | Wt 173.0 lb

## 2022-07-21 DIAGNOSIS — G43019 Migraine without aura, intractable, without status migrainosus: Secondary | ICD-10-CM | POA: Diagnosis not present

## 2022-07-21 MED ORDER — EMGALITY 120 MG/ML ~~LOC~~ SOAJ: 1.0000 "pen " | SUBCUTANEOUS | 11 refills | Status: DC

## 2022-07-21 MED ORDER — KETOROLAC TROMETHAMINE 60 MG/2ML IM SOLN
60.0000 mg | Freq: Once | INTRAMUSCULAR | Status: AC
  Administered 2022-07-21: 60 mg via INTRAMUSCULAR

## 2022-07-21 NOTE — Patient Instructions (Addendum)
See if you are eligible for an Emgality savings card at [email or web address]  If you do not qualify for a saving card, you may be eligible for financial assistance through [place] program: [email or web address]://[email or web address]/how-to-apply#choose-application

## 2022-08-08 ENCOUNTER — Ambulatory Visit: Payer: Managed Care, Other (non HMO) | Admitting: Psychiatry

## 2022-08-16 ENCOUNTER — Other Ambulatory Visit: Payer: Self-pay | Admitting: Nurse Practitioner

## 2022-08-30 ENCOUNTER — Other Ambulatory Visit: Payer: Self-pay | Admitting: Nurse Practitioner

## 2022-08-31 LAB — HGB A1C W/O EAG: Hgb A1c MFr Bld: 6.5 % — ABNORMAL HIGH (ref 4.8–5.6)

## 2022-08-31 LAB — LIPID PANEL W/O CHOL/HDL RATIO
Cholesterol, Total: 237 mg/dL — ABNORMAL HIGH (ref 100–199)
HDL: 67 mg/dL (ref >39)
LDL Chol Calc (NIH): 157 mg/dL — ABNORMAL HIGH (ref 0–99)
Triglycerides: 75 mg/dL (ref 0–149)
VLDL Cholesterol Cal: 13 mg/dL (ref 5–40)

## 2022-08-31 LAB — COMPREHENSIVE METABOLIC PANEL
ALT: 12 IU/L (ref 0–32)
AST: 16 IU/L (ref 0–40)
Albumin/Globulin Ratio: 1.5 (ref 1.2–2.2)
Albumin: 4 g/dL (ref 3.8–4.9)
Alkaline Phosphatase: 85 IU/L (ref 44–121)
BUN/Creatinine Ratio: 28 (ref 12–28)
BUN: 19 mg/dL (ref 8–27)
Bilirubin Total: 0.3 mg/dL (ref 0.0–1.2)
CO2: 22 mmol/L (ref 20–29)
Calcium: 9.8 mg/dL (ref 8.7–10.3)
Chloride: 102 mmol/L (ref 96–106)
Creatinine, Ser: 0.67 mg/dL (ref 0.57–1.00)
Globulin, Total: 2.7 g/dL (ref 1.5–4.5)
Glucose: 104 mg/dL — ABNORMAL HIGH (ref 70–99)
Potassium: 4.3 mmol/L (ref 3.5–5.2)
Sodium: 140 mmol/L (ref 134–144)
Total Protein: 6.7 g/dL (ref 6.0–8.5)
eGFR: 100 mL/min/{1.73_m2} (ref >59)

## 2022-08-31 LAB — TSH: TSH: 4.63 u[IU]/mL — ABNORMAL HIGH (ref 0.450–4.500)

## 2022-09-02 ENCOUNTER — Ambulatory Visit: Payer: Managed Care, Other (non HMO) | Admitting: Nurse Practitioner

## 2022-09-02 ENCOUNTER — Other Ambulatory Visit (HOSPITAL_COMMUNITY): Payer: Self-pay

## 2022-09-06 ENCOUNTER — Ambulatory Visit: Payer: Managed Care, Other (non HMO) | Admitting: Nurse Practitioner

## 2022-09-09 ENCOUNTER — Telehealth: Payer: Self-pay

## 2022-09-09 ENCOUNTER — Other Ambulatory Visit (HOSPITAL_COMMUNITY): Payer: Self-pay

## 2022-09-09 NOTE — Telephone Encounter (Signed)
Patient Advocate Encounter   Received notification from [facility] that prior authorization is required for Ubrelvy 100MG  tablets   Submitted: 09-09-2022 Key [id number]  Status is pending

## 2022-09-11 ENCOUNTER — Telehealth: Payer: Self-pay

## 2022-09-11 ENCOUNTER — Other Ambulatory Visit (HOSPITAL_COMMUNITY): Payer: Self-pay

## 2022-09-11 DIAGNOSIS — G43019 Migraine without aura, intractable, without status migrainosus: Secondary | ICD-10-CM

## 2022-09-11 NOTE — Telephone Encounter (Signed)
Patient Advocate Encounter   Received notification from [facility] that prior authorization is required for Emgality 120MG /ML auto-injectors (migraine)   Submitted: 09-11-2022 Key [id number]  Status is pending

## 2022-09-12 ENCOUNTER — Other Ambulatory Visit (HOSPITAL_COMMUNITY): Payer: Self-pay

## 2022-09-12 NOTE — Telephone Encounter (Signed)
Pharmacy Patient Advocate Encounter  Prior Authorization for Ubrelvy 100MG  tablets has been approved by [facility] (ins).    PA # PA Case ID #: [id number] Effective dates: 09/09/2022 through 09/09/2023

## 2022-09-15 ENCOUNTER — Other Ambulatory Visit: Payer: Self-pay | Admitting: Nurse Practitioner

## 2022-09-16 ENCOUNTER — Ambulatory Visit: Payer: Managed Care, Other (non HMO) | Admitting: Nurse Practitioner

## 2022-09-26 ENCOUNTER — Other Ambulatory Visit (HOSPITAL_COMMUNITY): Payer: Self-pay

## 2022-09-26 MED ORDER — AJOVY 225 MG/1.5ML ~~LOC~~ SOAJ: 225.0000 mg | SUBCUTANEOUS | 11 refills | Status: DC

## 2022-09-26 NOTE — Telephone Encounter (Signed)
Patient Advocate Encounter  Received a fax from [facility] regarding Prior Authorization for Emgality 120MG /ML auto-injectors (migraine).   Key: [id number]  Authorization has been DENIED due to

## 2022-09-26 NOTE — Telephone Encounter (Signed)
Patient informed with below, Rx was Ajovy sent.

## 2022-09-26 NOTE — Telephone Encounter (Signed)
Dr. Delena Bali out on medical leave. Dr. Epimenio Foot work in [place] this am. He advised to change rx to Ajovy since this is a covered option.

## 2022-09-30 ENCOUNTER — Ambulatory Visit: Payer: Managed Care, Other (non HMO) | Admitting: Nurse Practitioner

## 2022-10-03 ENCOUNTER — Ambulatory Visit: Payer: Managed Care, Other (non HMO) | Admitting: Nurse Practitioner

## 2022-10-18 ENCOUNTER — Ambulatory Visit: Payer: Managed Care, Other (non HMO) | Admitting: Nurse Practitioner

## 2022-10-24 ENCOUNTER — Ambulatory Visit: Payer: Managed Care, Other (non HMO) | Admitting: Internal Medicine

## 2022-10-24 VITALS — BP 112/62 | HR 82 | Ht 66.0 in | Wt 174.0 lb

## 2022-10-24 DIAGNOSIS — E119 Type 2 diabetes mellitus without complications: Secondary | ICD-10-CM | POA: Diagnosis not present

## 2022-10-24 DIAGNOSIS — E782 Mixed hyperlipidemia: Secondary | ICD-10-CM | POA: Diagnosis not present

## 2022-10-24 DIAGNOSIS — E039 Hypothyroidism, unspecified: Secondary | ICD-10-CM | POA: Diagnosis not present

## 2022-10-24 HISTORY — DX: Type 2 diabetes mellitus without complications: E11.9

## 2022-10-24 LAB — POC CREATINE & ALBUMIN,URINE
Albumin/Creatinine Ratio, Urine, POC: >300
Creatinine, POC: 50 mg/dL
Microalbumin Ur, POC: 150 mg/L

## 2022-10-24 LAB — POCT CBG (FASTING - GLUCOSE)-MANUAL ENTRY: Glucose Fasting, POC: 123 mg/dL — AB (ref 70–99)

## 2022-10-24 MED ORDER — LEVOTHYROXINE SODIUM 125 MCG PO TABS: 125.0000 ug | ORAL_TABLET | Freq: Every day | ORAL | 11 refills | Status: AC

## 2022-10-24 NOTE — Progress Notes (Signed)
Established Patient Office Visit  Subjective:  Patient ID: Lauren Rodriguez, female    DOB: 1961/03/01  Age: 62 y.o. MRN: [id number]  Chief Complaint  Patient presents with   Follow-up    Diabetes & Lab Results    No new complaints, here for lab review and medication refills. Last A1c was 6.5 howeverr TSH elevated on current dose of T4.    No other concerns at this time.   Past Medical History:  Diagnosis Date   Diabetes mellitus without complication (HCC)    Hyperlipidemia    Hypertension    Migraines    Thyroid disease     Past Surgical History:  Procedure Laterality Date   ABDOMINAL HYSTERECTOMY      Social History   Socioeconomic History   Marital status: Single    Spouse name: Not on file   Number of children: Not on file   Years of education: Not on file   Highest education level: Not on file  Occupational History   Not on file  Tobacco Use   Smoking status: Never   Smokeless tobacco: Never  Substance and Sexual Activity   Alcohol use: Yes    Alcohol/week: 2.0 standard drinks of alcohol    Types: 2 Glasses of wine per week   Drug use: Not on file   Sexual activity: Not on file  Other Topics Concern   Not on file  Social History Narrative   No caffeine use    Social Determinants of Health   Financial Resource Strain: Not on file  Food Insecurity: Not on file  Transportation Needs: Not on file  Physical Activity: Not on file  Stress: Not on file  Social Connections: Not on file  Intimate Partner Violence: Not on file    Family History  Problem Relation Age of Onset   Breast cancer Neg Hx     Allergies  Allergen Reactions   Other Hives and Swelling    Chocolate flavoring    Review of Systems  Constitutional:  Positive for malaise/fatigue.  HENT: Negative.    Eyes: Negative.   Respiratory: Negative.    Cardiovascular: Negative.   Gastrointestinal: Negative.   Genitourinary:  Positive for frequency.  Skin: Negative.    Neurological: Negative.   Endo/Heme/Allergies: Negative.        Objective:   BP 112/62   Pulse 82   Ht 5\' 6"  (1.676 m)   Wt 174 lb (78.9 kg)   SpO2 95%   BMI 28.08 kg/m   Vitals:   10/24/22 1014  BP: 112/62  Pulse: 82  Height: 5\' 6"  (1.676 m)  Weight: 174 lb (78.9 kg)  SpO2: 95%  BMI (Calculated): 28.1    Physical Exam Vitals reviewed.  Constitutional:      General: She is not in acute distress. HENT:     Head: Normocephalic.     Nose: Nose normal.     Mouth/Throat:     Mouth: Mucous membranes are moist.  Eyes:     Extraocular Movements: Extraocular movements intact.     Pupils: Pupils are equal, round, and reactive to light.  Cardiovascular:     Rate and Rhythm: Normal rate and regular rhythm.     Heart sounds: No murmur heard. Pulmonary:     Effort: Pulmonary effort is normal.     Breath sounds: No rhonchi or rales.  Abdominal:     General: Abdomen is flat.     Palpations: There is no hepatomegaly, splenomegaly or  mass.  Musculoskeletal:        General: Normal range of motion.     Cervical back: Normal range of motion. No tenderness.  Skin:    General: Skin is warm and dry.  Neurological:     General: No focal deficit present.     Mental Status: She is alert and oriented to person, place, and time.     Cranial Nerves: No cranial nerve deficit.     Motor: No weakness.  Psychiatric:        Mood and Affect: Mood normal.        Behavior: Behavior normal.      Results for orders placed or performed in visit on 10/24/22  POC CREATINE & ALBUMIN,URINE  Result Value Ref Range   Microalbumin Ur, POC 150 mg/L   Creatinine, POC 50 mg/dL   Albumin/Creatinine Ratio, Urine, POC >300   POCT CBG (Fasting - Glucose)  Result Value Ref Range   Glucose Fasting, POC 123 (A) 70 - 99 mg/dL    Recent Results (from the past 2160 hour([facility]))  Comprehensive metabolic panel     Status: Abnormal   Collection Time: 08/30/22  8:26 AM  Result Value Ref Range   Glucose  104 (H) 70 - 99 mg/dL   BUN 19 8 - 27 mg/dL   Creatinine, Ser 8.11 0.57 - 1.00 mg/dL   eGFR 914 >78 GN/FAO/1.30   BUN/Creatinine Ratio 28 12 - 28   Sodium 140 134 - 144 mmol/L   Potassium 4.3 3.5 - 5.2 mmol/L   Chloride 102 96 - 106 mmol/L   CO2 22 20 - 29 mmol/L   Calcium 9.8 8.7 - 10.3 mg/dL   Total Protein 6.7 6.0 - 8.5 g/dL   Albumin 4.0 3.8 - 4.9 g/dL   Globulin, Total 2.7 1.5 - 4.5 g/dL   Albumin/Globulin Ratio 1.5 1.2 - 2.2   Bilirubin Total 0.3 0.0 - 1.2 mg/dL   Alkaline Phosphatase 85 44 - 121 IU/L   AST 16 0 - 40 IU/L   ALT 12 0 - 32 IU/L  Lipid Panel w/o Chol/HDL Ratio     Status: Abnormal   Collection Time: 08/30/22  8:26 AM  Result Value Ref Range   Cholesterol, Total 237 (H) 100 - 199 mg/dL   Triglycerides 75 0 - 149 mg/dL   HDL 67 >86 mg/dL   VLDL Cholesterol Cal 13 5 - 40 mg/dL   LDL Chol Calc (NIH) 578 (H) 0 - 99 mg/dL  Hgb I6N w/o eAG     Status: Abnormal   Collection Time: 08/30/22  8:26 AM  Result Value Ref Range   Hgb A1c MFr Bld 6.5 (H) 4.8 - 5.6 %    Comment:          Prediabetes: 5.7 - 6.4          Diabetes: >6.4          Glycemic control for adults with diabetes: <7.0   TSH     Status: Abnormal   Collection Time: 08/30/22  8:26 AM  Result Value Ref Range   TSH 4.630 (H) 0.450 - 4.500 uIU/mL  POCT CBG (Fasting - Glucose)     Status: Abnormal   Collection Time: 10/24/22 10:43 AM  Result Value Ref Range   Glucose Fasting, POC 123 (A) 70 - 99 mg/dL  POC CREATINE & ALBUMIN,URINE     Status: Abnormal   Collection Time: 10/24/22 10:45 AM  Result Value Ref Range   Microalbumin Ur,  POC 150 mg/L   Creatinine, POC 50 mg/dL   Albumin/Creatinine Ratio, Urine, POC >300       Assessment & Plan:  As per problem list. Increase T4.  Problem List Items Addressed This Visit       Endocrine   Controlled type 2 diabetes mellitus without complication, without long-term current use of insulin (HCC)   Relevant Orders   POC CREATINE & ALBUMIN,URINE  (Completed)   Lipid panel   Hemoglobin A1c   POCT CBG (Fasting - Glucose) (Completed)   Acquired hypothyroidism - Primary   Relevant Medications   levothyroxine (SYNTHROID) 125 MCG tablet   Other Relevant Orders   TSH     Other   Mixed hyperlipidemia   Relevant Orders   Lipid panel    Return in about 6 weeks (around 12/05/2022) for fu with labs prior.   Total time spent: 20 minutes  Luna Fuse, MD  10/24/2022   This document may have been prepared by [facility] Voice Recognition software and as such may include unintentional dictation errors.

## 2022-10-25 NOTE — Progress Notes (Signed)
Patient notified

## 2022-11-01 ENCOUNTER — Ambulatory Visit (INDEPENDENT_AMBULATORY_CARE_PROVIDER_SITE_OTHER): Payer: Managed Care, Other (non HMO) | Admitting: Internal Medicine

## 2022-11-01 VITALS — BP 125/90 | HR 85 | Ht 66.0 in | Wt 173.8 lb

## 2022-11-01 DIAGNOSIS — E119 Type 2 diabetes mellitus without complications: Secondary | ICD-10-CM | POA: Diagnosis not present

## 2022-11-01 DIAGNOSIS — E0821 Diabetes mellitus due to underlying condition with diabetic nephropathy: Secondary | ICD-10-CM | POA: Diagnosis not present

## 2022-11-01 LAB — POCT CBG (FASTING - GLUCOSE)-MANUAL ENTRY: Glucose Fasting, POC: 105 mg/dL — AB (ref 70–99)

## 2022-11-01 MED ORDER — KERENDIA 10 MG PO TABS
10.0000 mg | ORAL_TABLET | Freq: Every day | ORAL | Status: AC
Start: 2022-11-01 — End: 2022-11-15

## 2022-11-01 MED ORDER — KERENDIA 20 MG PO TABS
20.0000 mg | ORAL_TABLET | Freq: Every day | ORAL | 1 refills | Status: AC
Start: 2022-11-01 — End: 2022-12-31

## 2022-11-01 NOTE — Progress Notes (Signed)
Established Patient Office Visit  Subjective:  Patient ID: Lauren Rodriguez, female    DOB: February 01, 1961  Age: 62 y.o. MRN: [id number]  Chief Complaint  Patient presents with   Follow-up    Lab F/U    Here to discuss abnormal microalbumin.    No other concerns at this time.   Past Medical History:  Diagnosis Date   Diabetes mellitus without complication (HCC)    Hyperlipidemia    Hypertension    Migraines    Thyroid disease     Past Surgical History:  Procedure Laterality Date   ABDOMINAL HYSTERECTOMY      Social History   Socioeconomic History   Marital status: Single    Spouse name: Not on file   Number of children: Not on file   Years of education: Not on file   Highest education level: Not on file  Occupational History   Not on file  Tobacco Use   Smoking status: Never   Smokeless tobacco: Never  Substance and Sexual Activity   Alcohol use: Yes    Alcohol/week: 2.0 standard drinks of alcohol    Types: 2 Glasses of wine per week   Drug use: Not on file   Sexual activity: Not on file  Other Topics Concern   Not on file  Social History Narrative   No caffeine use    Social Determinants of Health   Financial Resource Strain: Not on file  Food Insecurity: Not on file  Transportation Needs: Not on file  Physical Activity: Not on file  Stress: Not on file  Social Connections: Not on file  Intimate Partner Violence: Not on file    Family History  Problem Relation Age of Onset   Breast cancer Neg Hx     Allergies  Allergen Reactions   Other Hives and Swelling    Chocolate flavoring    Review of Systems  Constitutional:  Positive for malaise/fatigue.  HENT: Negative.    Eyes: Negative.   Respiratory: Negative.    Cardiovascular: Negative.   Gastrointestinal: Negative.   Genitourinary:  Positive for frequency.  Skin: Negative.   Neurological: Negative.   Endo/Heme/Allergies: Negative.        Objective:   BP (!) 125/90   Pulse  85   Ht 5\' 6"  (1.676 m)   Wt 173 lb 12.8 oz (78.8 kg)   SpO2 98%   BMI 28.05 kg/m   Vitals:   11/01/22 1128  BP: (!) 125/90  Pulse: 85  Height: 5\' 6"  (1.676 m)  Weight: 173 lb 12.8 oz (78.8 kg)  SpO2: 98%  BMI (Calculated): 28.07    Physical Exam Vitals reviewed.  Constitutional:      General: She is not in acute distress. HENT:     Head: Normocephalic.     Nose: Nose normal.     Mouth/Throat:     Mouth: Mucous membranes are moist.  Eyes:     Extraocular Movements: Extraocular movements intact.     Pupils: Pupils are equal, round, and reactive to light.  Cardiovascular:     Rate and Rhythm: Normal rate and regular rhythm.     Heart sounds: No murmur heard. Pulmonary:     Effort: Pulmonary effort is normal.     Breath sounds: No rhonchi or rales.  Abdominal:     General: Abdomen is flat.     Palpations: There is no hepatomegaly, splenomegaly or mass.  Musculoskeletal:        General: Normal range  of motion.     Cervical back: Normal range of motion. No tenderness.  Skin:    General: Skin is warm and dry.  Neurological:     General: No focal deficit present.     Mental Status: She is alert and oriented to person, place, and time.     Cranial Nerves: No cranial nerve deficit.     Motor: No weakness.  Psychiatric:        Mood and Affect: Mood normal.        Behavior: Behavior normal.      Results for orders placed or performed in visit on 11/01/22  POCT CBG (Fasting - Glucose)  Result Value Ref Range   Glucose Fasting, POC 105 (A) 70 - 99 mg/dL    Recent Results (from the past 2160 hour([facility]))  Comprehensive metabolic panel     Status: Abnormal   Collection Time: 08/30/22  8:26 AM  Result Value Ref Range   Glucose 104 (H) 70 - 99 mg/dL   BUN 19 8 - 27 mg/dL   Creatinine, Ser 1.61 0.57 - 1.00 mg/dL   eGFR 096 >04 VW/UJW/1.19   BUN/Creatinine Ratio 28 12 - 28   Sodium 140 134 - 144 mmol/L   Potassium 4.3 3.5 - 5.2 mmol/L   Chloride 102 96 - 106 mmol/L    CO2 22 20 - 29 mmol/L   Calcium 9.8 8.7 - 10.3 mg/dL   Total Protein 6.7 6.0 - 8.5 g/dL   Albumin 4.0 3.8 - 4.9 g/dL   Globulin, Total 2.7 1.5 - 4.5 g/dL   Albumin/Globulin Ratio 1.5 1.2 - 2.2   Bilirubin Total 0.3 0.0 - 1.2 mg/dL   Alkaline Phosphatase 85 44 - 121 IU/L   AST 16 0 - 40 IU/L   ALT 12 0 - 32 IU/L  Lipid Panel w/o Chol/HDL Ratio     Status: Abnormal   Collection Time: 08/30/22  8:26 AM  Result Value Ref Range   Cholesterol, Total 237 (H) 100 - 199 mg/dL   Triglycerides 75 0 - 149 mg/dL   HDL 67 >14 mg/dL   VLDL Cholesterol Cal 13 5 - 40 mg/dL   LDL Chol Calc (NIH) 782 (H) 0 - 99 mg/dL  Hgb N5A w/o eAG     Status: Abnormal   Collection Time: 08/30/22  8:26 AM  Result Value Ref Range   Hgb A1c MFr Bld 6.5 (H) 4.8 - 5.6 %    Comment:          Prediabetes: 5.7 - 6.4          Diabetes: >6.4          Glycemic control for adults with diabetes: <7.0   TSH     Status: Abnormal   Collection Time: 08/30/22  8:26 AM  Result Value Ref Range   TSH 4.630 (H) 0.450 - 4.500 uIU/mL  POCT CBG (Fasting - Glucose)     Status: Abnormal   Collection Time: 10/24/22 10:43 AM  Result Value Ref Range   Glucose Fasting, POC 123 (A) 70 - 99 mg/dL  POC CREATINE & ALBUMIN,URINE     Status: Abnormal   Collection Time: 10/24/22 10:45 AM  Result Value Ref Range   Microalbumin Ur, POC 150 mg/L   Creatinine, POC 50 mg/dL   Albumin/Creatinine Ratio, Urine, POC >300   POCT CBG (Fasting - Glucose)     Status: Abnormal   Collection Time: 11/01/22 11:33 AM  Result Value Ref Range   Glucose  Fasting, POC 105 (A) 70 - 99 mg/dL      Assessment & Plan:  As per problem list, start Kerendia and fu in 1 mo to check potassium. 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   Total time spent: 20 minutes  Luna Fuse, MD  11/01/2022   This document may have been prepared by [facility] Voice Recognition software and as such may include unintentional dictation errors.

## 2022-12-02 ENCOUNTER — Ambulatory Visit: Payer: Managed Care, Other (non HMO) | Admitting: Cardiology

## 2022-12-05 ENCOUNTER — Other Ambulatory Visit: Payer: Self-pay

## 2022-12-05 MED ORDER — ROSUVASTATIN CALCIUM 40 MG PO TABS: 40.0000 mg | ORAL_TABLET | Freq: Every day | ORAL | 0 refills | Status: DC

## 2022-12-09 ENCOUNTER — Ambulatory Visit (INDEPENDENT_AMBULATORY_CARE_PROVIDER_SITE_OTHER): Payer: Managed Care, Other (non HMO) | Admitting: Cardiology

## 2022-12-09 ENCOUNTER — Encounter: Payer: Self-pay | Admitting: Cardiology

## 2022-12-09 VITALS — BP 118/82 | HR 64 | Ht 66.0 in | Wt 176.6 lb

## 2022-12-09 DIAGNOSIS — E782 Mixed hyperlipidemia: Secondary | ICD-10-CM | POA: Diagnosis not present

## 2022-12-09 DIAGNOSIS — E119 Type 2 diabetes mellitus without complications: Secondary | ICD-10-CM | POA: Diagnosis not present

## 2022-12-09 DIAGNOSIS — E039 Hypothyroidism, unspecified: Secondary | ICD-10-CM

## 2022-12-09 DIAGNOSIS — E0821 Diabetes mellitus due to underlying condition with diabetic nephropathy: Secondary | ICD-10-CM

## 2022-12-09 NOTE — Progress Notes (Signed)
Established Patient Office Visit  Subjective:  Patient ID: Lauren Rodriguez, female    DOB: 22-May-1960  Age: 62 y.o. MRN: [id number]  Chief Complaint  Patient presents with   Follow-up    1 month follow up    Patient in office for one month follow up with lab results. Patient did not have blood work done. Will do today. Patient was started on [place] one month ago. Patient taking and tolerating.     No other concerns at this time.   Past Medical History:  Diagnosis Date   Diabetes mellitus without complication (HCC)    Hyperlipidemia    Hypertension    Migraines    Thyroid disease     Past Surgical History:  Procedure Laterality Date   ABDOMINAL HYSTERECTOMY      Social History   Socioeconomic History   Marital status: Single    Spouse name: Not on file   Number of children: Not on file   Years of education: Not on file   Highest education level: Not on file  Occupational History   Not on file  Tobacco Use   Smoking status: Never   Smokeless tobacco: Never  Substance and Sexual Activity   Alcohol use: Yes    Alcohol/week: 2.0 standard drinks of alcohol    Types: 2 Glasses of wine per week   Drug use: Not on file   Sexual activity: Not on file  Other Topics Concern   Not on file  Social History Narrative   No caffeine use    Social Determinants of Health   Financial Resource Strain: Not on file  Food Insecurity: Not on file  Transportation Needs: Not on file  Physical Activity: Not on file  Stress: Not on file  Social Connections: Not on file  Intimate Partner Violence: Not on file    Family History  Problem Relation Age of Onset   Breast cancer Neg Hx     Allergies  Allergen Reactions   Other Hives and Swelling    Chocolate flavoring    Review of Systems  Constitutional: Negative.   HENT: Negative.    Eyes: Negative.   Respiratory: Negative.  Negative for shortness of breath.   Cardiovascular: Negative.  Negative for chest pain.   Gastrointestinal: Negative.  Negative for abdominal pain, constipation and diarrhea.  Genitourinary: Negative.   Musculoskeletal:  Negative for joint pain and myalgias.  Skin: Negative.   Neurological: Negative.  Negative for dizziness and headaches.  Endo/Heme/Allergies: Negative.   All other systems reviewed and are negative.      Objective:   BP 118/82   Pulse 64   Ht 5\' 6"  (1.676 m)   Wt 176 lb 9.6 oz (80.1 kg)   SpO2 100%   BMI 28.50 kg/m   Vitals:   12/09/22 1027  BP: 118/82  Pulse: 64  Height: 5\' 6"  (1.676 m)  Weight: 176 lb 9.6 oz (80.1 kg)  SpO2: 100%  BMI (Calculated): 28.52    Physical Exam Vitals and nursing note reviewed.  Constitutional:      Appearance: Normal appearance. She is normal weight.  HENT:     Head: Normocephalic and atraumatic.     Nose: Nose normal.     Mouth/Throat:     Mouth: Mucous membranes are moist.  Eyes:     Extraocular Movements: Extraocular movements intact.     Conjunctiva/sclera: Conjunctivae normal.     Pupils: Pupils are equal, round, and reactive to light.  Cardiovascular:  Rate and Rhythm: Normal rate and regular rhythm.     Pulses: Normal pulses.     Heart sounds: Normal heart sounds.  Pulmonary:     Effort: Pulmonary effort is normal.     Breath sounds: Normal breath sounds.  Abdominal:     General: Abdomen is flat. Bowel sounds are normal.     Palpations: Abdomen is soft.  Musculoskeletal:        General: Normal range of motion.     Cervical back: Normal range of motion.  Skin:    General: Skin is warm and dry.  Neurological:     General: No focal deficit present.     Mental Status: She is alert and oriented to person, place, and time.  Psychiatric:        Mood and Affect: Mood normal.        Behavior: Behavior normal.        Thought Content: Thought content normal.        Judgment: Judgment normal.      No results found for any visits on 12/09/22.  Recent Results (from the past 2160 hour(s))   POCT CBG (Fasting - Glucose)     Status: Abnormal   Collection Time: 10/24/22 10:43 AM  Result Value Ref Range   Glucose Fasting, POC 123 (A) 70 - 99 mg/dL  POC CREATINE & ALBUMIN,URINE     Status: Abnormal   Collection Time: 10/24/22 10:45 AM  Result Value Ref Range   Microalbumin Ur, POC 150 mg/L   Creatinine, POC 50 mg/dL   Albumin/Creatinine Ratio, Urine, POC >300   POCT CBG (Fasting - Glucose)     Status: Abnormal   Collection Time: 11/01/22 11:33 AM  Result Value Ref Range   Glucose Fasting, POC 105 (A) 70 - 99 mg/dL      Assessment & Plan:  Continue Leonides Grills. Blood work today.   Problem List Items Addressed This Visit       Endocrine   Controlled type 2 diabetes mellitus without complication, without long-term current use of insulin (HCC) - Primary   Acquired hypothyroidism     Other   Mixed hyperlipidemia    Return in about 3 months (around 03/11/2023) for with fasting labs prior.   Total time spent: 25 minutes  [employer], NP  12/09/2022   This document may have been prepared by Dragon Voice Recognition software and as such may include unintentional dictation errors.

## 2022-12-10 LAB — LIPID PANEL
Chol/HDL Ratio: 2.8 ratio (ref 0.0–4.4)
Cholesterol, Total: 178 mg/dL (ref 100–199)
HDL: 64 mg/dL (ref >39)
LDL Chol Calc (NIH): 103 mg/dL — ABNORMAL HIGH (ref 0–99)
Triglycerides: 58 mg/dL (ref 0–149)
VLDL Cholesterol Cal: 11 mg/dL (ref 5–40)

## 2022-12-10 LAB — BMP8+ANION GAP
Anion Gap: 14 mmol/L (ref 10.0–18.0)
BUN/Creatinine Ratio: 28 (ref 12–28)
BUN: 19 mg/dL (ref 8–27)
CO2: 23 mmol/L (ref 20–29)
Calcium: 10.2 mg/dL (ref 8.7–10.3)
Chloride: 101 mmol/L (ref 96–106)
Creatinine, Ser: 0.69 mg/dL (ref 0.57–1.00)
Glucose: 102 mg/dL — ABNORMAL HIGH (ref 70–99)
Potassium: 5 mmol/L (ref 3.5–5.2)
Sodium: 138 mmol/L (ref 134–144)
eGFR: 99 mL/min/{1.73_m2} (ref >59)

## 2022-12-10 LAB — HEMOGLOBIN A1C
Est. average glucose Bld gHb Est-mCnc: 140 mg/dL
Hgb A1c MFr Bld: 6.5 % — ABNORMAL HIGH (ref 4.8–5.6)

## 2022-12-10 LAB — TSH: TSH: 3.02 u[IU]/mL (ref 0.450–4.500)

## 2022-12-13 NOTE — Progress Notes (Signed)
Patient notified

## 2022-12-21 ENCOUNTER — Ambulatory Visit: Payer: Managed Care, Other (non HMO) | Admitting: Internal Medicine

## 2023-01-25 ENCOUNTER — Telehealth: Payer: Self-pay | Admitting: Psychiatry

## 2023-01-25 MED ORDER — UBRELVY 100 MG PO TABS: 100.0000 mg | ORAL_TABLET | ORAL | 5 refills | Status: DC | PRN

## 2023-01-25 NOTE — Telephone Encounter (Signed)
Pt is needing a refill on her Ubrogepant (UBRELVY) 100 MG TABS  and her Fremanezumab-vfrm (AJOVY) 225 MG/1.5ML SOAJ  and have it sent to the [facility] on [address].

## 2023-01-25 NOTE — Telephone Encounter (Signed)
Phone room: please call pt back. I sent in refills for Ubrelvy.   Ajovy refill last sent 09/26/22 #1.14ml (30 days supply) and 11 refills. There should be refill on file at pharmacy for Ajovy.

## 2023-01-26 ENCOUNTER — Telehealth: Payer: Self-pay | Admitting: Psychiatry

## 2023-01-26 ENCOUNTER — Other Ambulatory Visit (HOSPITAL_COMMUNITY): Payer: Self-pay

## 2023-01-26 NOTE — Telephone Encounter (Signed)
Noted  

## 2023-01-26 NOTE — Telephone Encounter (Deleted)
   I ran a test claim which is our standard protocol and received a successful claim-PT has two insurances and [employer] is secondary. Pt should be able to get the medication.

## 2023-01-26 NOTE — Telephone Encounter (Signed)
Called patient and pt said pharmacy told her insurance issue, I called [facility] and Ajovy needs Prior Authorization.   Encounter sent to Prior Auth team.

## 2023-01-26 NOTE — Telephone Encounter (Signed)
Pt request refill Fremanezumab-vfrm (AJOVY) 225 MG/1.5ML SOAJ sent to  [id number]

## 2023-01-27 ENCOUNTER — Other Ambulatory Visit (HOSPITAL_COMMUNITY): Payer: Self-pay

## 2023-01-27 ENCOUNTER — Telehealth: Payer: Self-pay

## 2023-01-27 DIAGNOSIS — G43019 Migraine without aura, intractable, without status migrainosus: Secondary | ICD-10-CM

## 2023-01-27 NOTE — Telephone Encounter (Signed)
Pharmacy Patient Advocate Encounter   Received notification from Physician's Office that prior authorization for AJOVY (fremanezumab-vfrm) injection 225MG /1.5ML auto-injectors is required/requested.   Insurance verification completed.   The patient is insured through [facility] .   Per test claim: PA required; PA submitted to [facility] via CoverMyMeds Key/confirmation #/EOC [id number] Status is pending

## 2023-01-30 ENCOUNTER — Other Ambulatory Visit (HOSPITAL_COMMUNITY): Payer: Self-pay

## 2023-01-30 NOTE — Telephone Encounter (Deleted)
   Ran a test claim and got a successful paid claim-no PA needed. I also tried to call [facility] to let them know to run it-they have [facility] as primary-I tried to update the insurance to [facility] and was put on hold and they never came back-PT might need to make sure [facility] has her updated primary insurance    This is the Pharmacy benefit info they will need. Tried to give to pharmacy with no luck due to technical issues.

## 2023-01-30 NOTE — Telephone Encounter (Signed)
Phone room: please call pt. Prior auth team ran test claim and showed no prior auth needed for Ajovy. They are not running her primary insurance. She needs to make sure she gives pharmacy updated insurance information and Ajovy should go through ok at that point

## 2023-01-31 NOTE — Telephone Encounter (Signed)
Noted  

## 2023-02-08 ENCOUNTER — Other Ambulatory Visit: Payer: Self-pay | Admitting: Cardiology

## 2023-02-08 DIAGNOSIS — Z1231 Encounter for screening mammogram for malignant neoplasm of breast: Secondary | ICD-10-CM

## 2023-02-09 ENCOUNTER — Ambulatory Visit (INDEPENDENT_AMBULATORY_CARE_PROVIDER_SITE_OTHER): Payer: Managed Care, Other (non HMO) | Admitting: Adult Health

## 2023-02-09 ENCOUNTER — Telehealth: Payer: Self-pay

## 2023-02-09 ENCOUNTER — Encounter: Payer: Self-pay | Admitting: Adult Health

## 2023-02-09 VITALS — BP 113/72 | HR 75 | Ht 66.0 in | Wt 175.0 lb

## 2023-02-09 DIAGNOSIS — G43019 Migraine without aura, intractable, without status migrainosus: Secondary | ICD-10-CM

## 2023-02-09 MED ORDER — AIMOVIG 140 MG/ML ~~LOC~~ SOAJ: 1.0000 mL | SUBCUTANEOUS | 11 refills | Status: DC

## 2023-02-09 NOTE — Patient Instructions (Addendum)
Your Plan:  Will look into Ajovy coverage as it looks like this medication is being covered on our end - we will keep you updated  Continue Ubrelvy as needed for migraine rescue      Follow up in 6 months or call earlier if needed       Thank you for coming to see [place] at [facility]. I hope we have been able to provide you high quality care today.  You may receive a patient satisfaction survey over the next few weeks. We would appreciate your feedback and comments so that we may continue to improve ourselves and the health of our patients.

## 2023-02-09 NOTE — Telephone Encounter (Signed)
Called [facility] at [phone]. Spoke w/ tech. Stating Ajovy needs PA.   ID: [id number] BIN: [id number] PCN: [id number] Grp: [id number]  I called [employer] at [phone]. Confirmed she has pharmacy coverage via optumrx.   I submitted PA on covermymeds. Key: [id number]. Marked urgent. Waiting on determination.

## 2023-02-09 NOTE — Telephone Encounter (Signed)
This was already handled. See other phone note below. The pt was aware? Not sure why she didn't mention this?

## 2023-02-09 NOTE — Addendum Note (Signed)
Addended by: Arther Abbott on: 02/09/2023 03:02 PM   Modules accepted: Orders

## 2023-02-09 NOTE — Progress Notes (Signed)
CC:  headaches Chief Complaint  Patient presents with   Follow-up    Patient in room #3 and alone. Patient states she having issues with getting her injection approved. Patient states she having headaches and migraines everyday. Patient states while on Emgality it help with her migraines.     Follow-up Visit  Last visit: 07/21/2022 with Dr. Delena Bali  Brief HPI: 62 year old female with a history of DM, hypothyroidism, proteinuria who follows in clinic for migraines. Brain MRI 07/22/21 showed chronic microvascular changes and was otherwise unremarkable.   At her last visit, attempted to get Emgality restarted as insurance stopped covering and noted worsening of migraines, continued on Ubrelvy for migraine rescue.   Interval History:  Insurance continued to decline Emgality therefore switched to Ajovy but having difficulty getting approved. Per epic review, Ajovy did not require a PA through insurance and is being covered but patient was told last week by her insurance company that they would not cover as there was not enough documentation.  She did receive 1 injection in June but none since that time.  She continues to have daily mild headaches, has at least one migraine per week and can last up to 2 days at a time. Reports Bernita Raisin typically helps for migraine rescue but also notes taking it daily. Due to migraine frequency, she has been missing work frequently.     Migraine days per month: 8 Headache free days per month: 22  Current Headache Regimen: Preventative: none Abortive: Ubrelvy 100 mg PRN   Prior Therapies                                  Prevention: Citalopram  Propranolol amitriptyline Lisinopril 5 mg daily Emgality 120 mg monthly - work but Automotive engineer point injections   Rescue: Imitrex Maxalt Zomig Ubrelvy 100 mg PRN Fioricet      Current Outpatient Medications on File Prior to Visit  Medication Sig Dispense Refill   empagliflozin  (JARDIANCE) 25 MG TABS tablet TAKE 1 TABLET BY MOUTH EVERY MORNING 90 tablet 3   Fremanezumab-vfrm (AJOVY) 225 MG/1.5ML SOAJ Inject 225 mg into the skin every 30 (thirty) days. 1.5 mL 11   Galcanezumab-gnlm (EMGALITY) 120 MG/ML SOAJ Inject into the skin.     levothyroxine (SYNTHROID) 125 MCG tablet Take 1 tablet (125 mcg total) by mouth daily. 30 tablet 11   lisinopril (ZESTRIL) 5 MG tablet Take 5 mg by mouth daily.     rosuvastatin (CRESTOR) 40 MG tablet Take 1 tablet (40 mg total) by mouth daily. 90 tablet 0   Ubrogepant (UBRELVY) 100 MG TABS Take 1 tablet (100 mg total) by mouth as needed. May repeat a dose in 2 hours if headache persists. Max dose 2 pills in 24 hours 16 tablet 5   No current facility-administered medications on file prior to visit.   Past Medical History:  Diagnosis Date   Diabetes mellitus without complication (HCC)    Hyperlipidemia    Hypertension    Migraines    Thyroid disease    Past Surgical History:  Procedure Laterality Date   ABDOMINAL HYSTERECTOMY            Physical Exam:   Vital Signs: BP 113/72 (BP Location: Left Arm, Patient Position: Sitting, Cuff Size: Small)   Pulse 75   Ht 5\' 6"  (1.676 m)   Wt 175 lb (79.4 kg)   BMI 28.25  kg/m  GENERAL:  well appearing, in no acute distress, alert  SKIN:  Color, texture, turgor normal. No rashes or lesions HEAD:  Normocephalic/atraumatic. RESP: normal respiratory effort MSK:  No gross joint deformities.   NEUROLOGICAL: Mental Status: Alert, oriented to person, place and time, Follows commands, and Speech fluent and appropriate. Cranial Nerves: PERRL, face symmetric, no dysarthria, hearing grossly intact Motor: moves all extremities equally Gait: normal-based.    IMPRESSION: 62 year old female with a history of DM, hypothyroidism, proteinuria who presents for follow up of migraines.  Headaches initially improved on Emgality but insurance stopped covering and required trial of Aimovig or Ajovy  therefore switched to Ajovy.  She continues to have difficulty obtaining. Ubrelvy for rescue but has been taking daily.    PLAN: -Preventive: needs to start Ajovy - will look into reason this is showing being covered on our end but insurance is telling her they will not cover -Rescue: Continue Ubrelvy 100 mg PRN, advised to take on an as needed basis  -Next steps: will need to fail Aimovig if no benefit with Ajovy prior to restarting Emgality     Follow up in 6 months or call earlier if needed    I spent 25 minutes of face-to-face and non-face-to-face time with patient.  This included previsit chart review, lab review, study review, order entry, electronic health record documentation, patient education and discussion regarding above diagnoses and treatment plan and answered all other questions to patient's satisfaction  Ihor Austin, [facility]  [facility] [address] [place], [place] [address]  Phone [phone] Fax [phone] Note: This document was prepared with digital dictation and possible smart phrase technology. Any transcriptional errors that result from this process are unintentional.

## 2023-02-09 NOTE — Telephone Encounter (Signed)
Pt called inquiring about FMLA paper work- please advise

## 2023-02-09 NOTE — Telephone Encounter (Signed)
Ajovy denied, not covered by plan.  Per Shanda Bumps, can call in Aimovig instead

## 2023-02-09 NOTE — Telephone Encounter (Signed)
Patient was told by her insurance company last week that they would not cover Ajovy due to lack of documentation and did not feel Ajovy was needed. Can this please be looked into as it appears there was no issue with coverage on our end. Please update patient when able. Thank you.

## 2023-02-09 NOTE — Telephone Encounter (Signed)
She said she spoke with you about this and did update her pharmacy with the updated insurance information but was told last week (after you spoke with her) by her insurance it was not going to be covered. I do not know if this is an insurance issue or pharmacy issue or possibly the patient has been given incorrect information.

## 2023-02-10 ENCOUNTER — Ambulatory Visit
Admission: RE | Admit: 2023-02-10 | Discharge: 2023-02-10 | Disposition: A | Discharge status: Home / Self Care | Payer: Managed Care, Other (non HMO) | Source: Ambulatory Visit | Attending: Cardiology

## 2023-02-10 ENCOUNTER — Other Ambulatory Visit: Payer: Self-pay

## 2023-02-10 ENCOUNTER — Encounter: Payer: Self-pay | Admitting: Cardiology

## 2023-02-10 DIAGNOSIS — Z1231 Encounter for screening mammogram for malignant neoplasm of breast: Secondary | ICD-10-CM | POA: Diagnosis present

## 2023-02-10 MED ORDER — KERENDIA 20 MG PO TABS: 20.0000 mg | ORAL_TABLET | Freq: Every day | ORAL | 5 refills | Status: DC

## 2023-02-14 NOTE — Progress Notes (Signed)
DOB verified and patient notified.

## 2023-02-18 IMAGING — MG MM DIGITAL SCREENING BILAT W/ TOMO AND CAD
6 of 10 series · 6 of 30 positions shown · non-contrast
Comparison: Previous exam(s).

CLINICAL DATA: Screening.

EXAM:
DIGITAL SCREENING BILATERAL MAMMOGRAM WITH TOMOSYNTHESIS AND CAD
TECHNIQUE: Bilateral screening digital craniocaudal and mediolateral oblique
mammograms were obtained. Bilateral screening digital breast
tomosynthesis was performed. The images were evaluated with
computer-aided detection.

[L CC synth-2D]
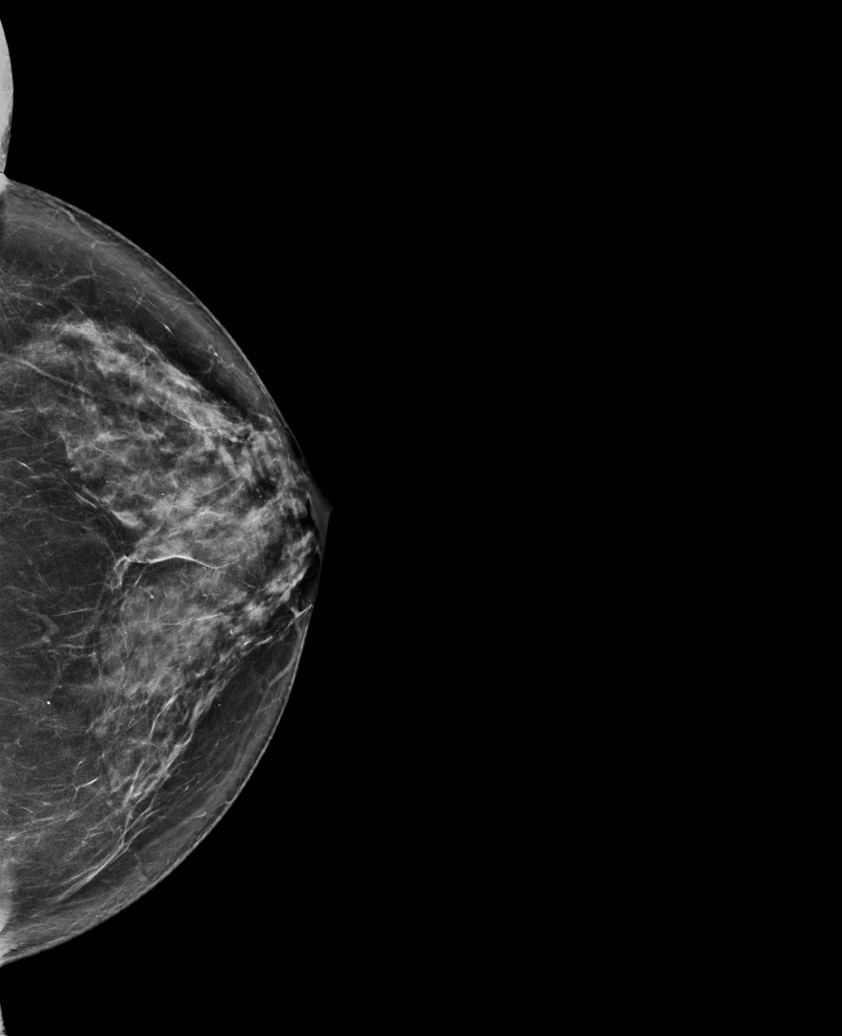

[L MLO synth-2D]
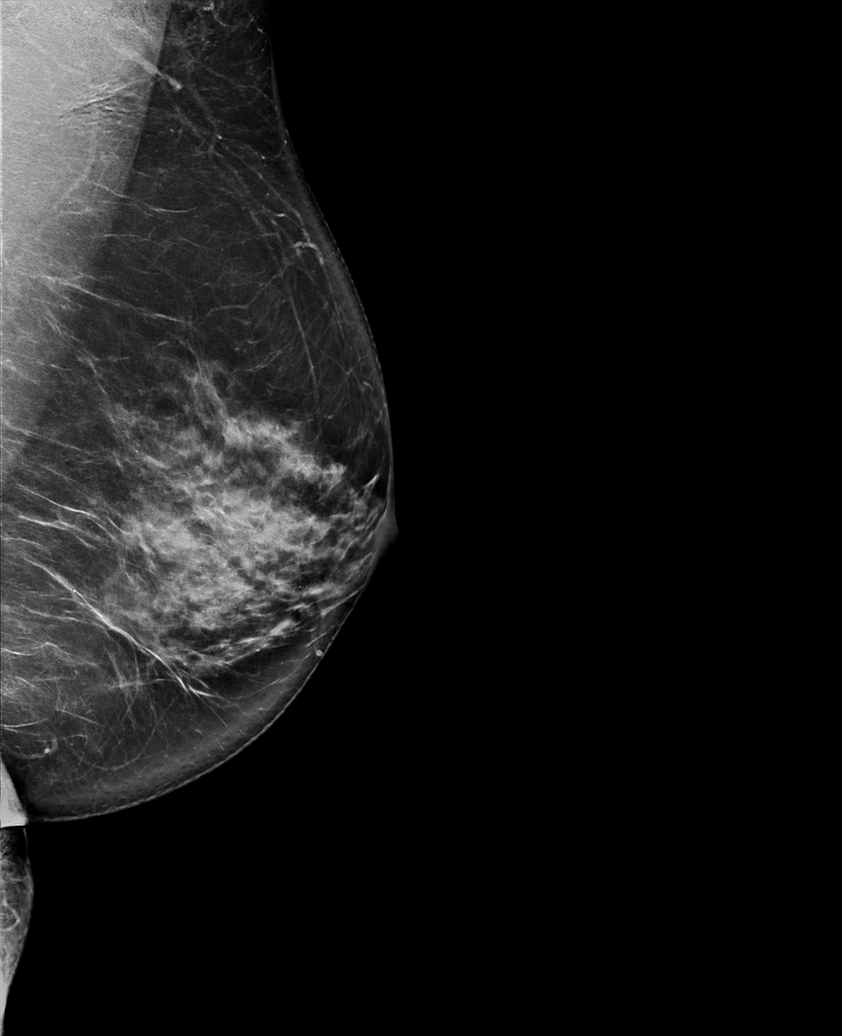

[R CC synth-2D (1 of 2)]
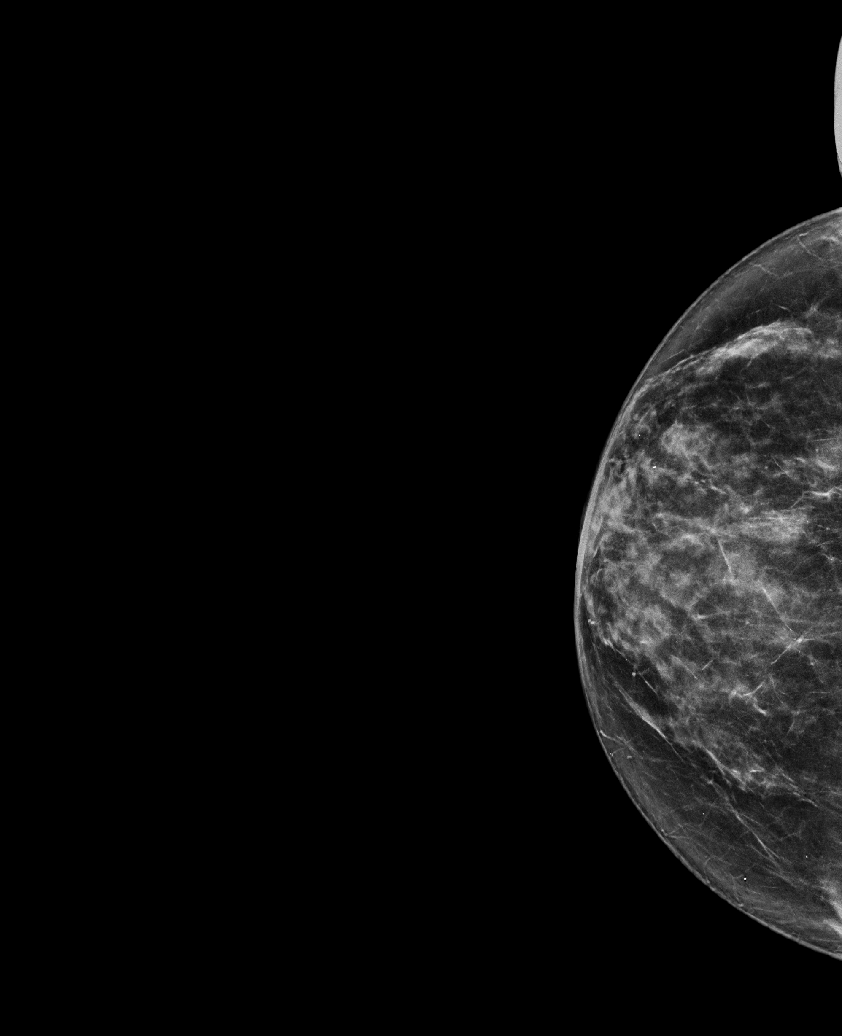

[R CC synth-2D (2 of 2)]
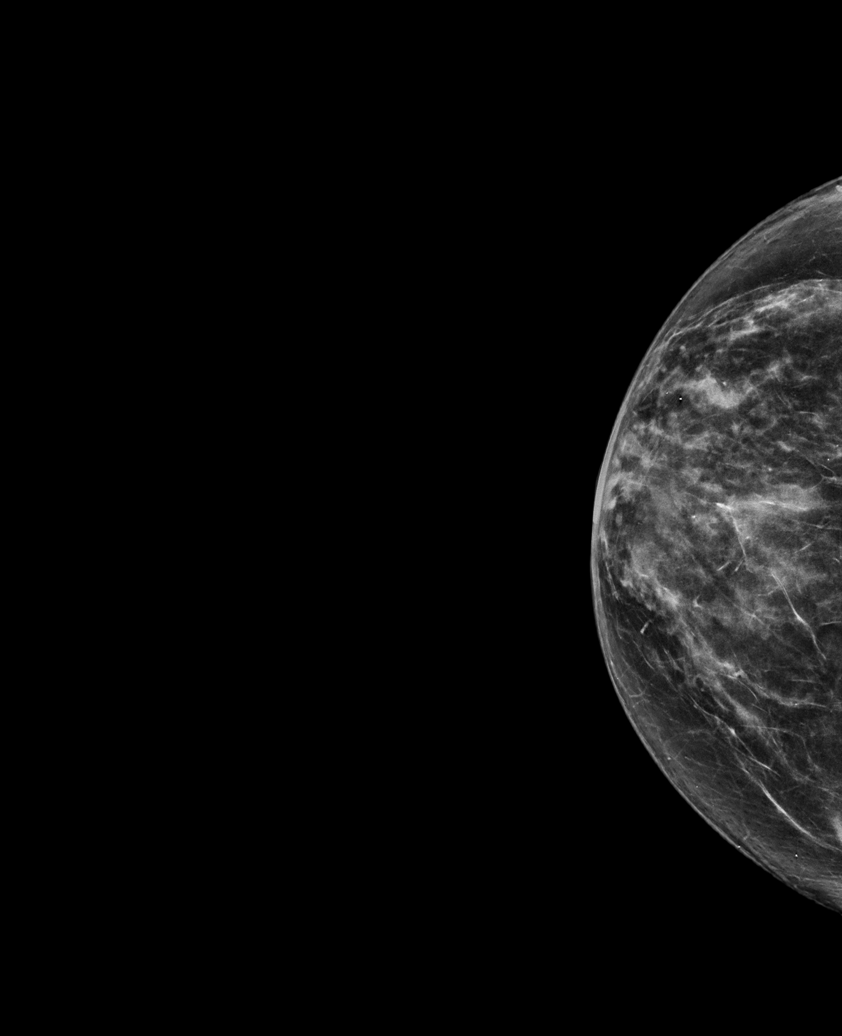

[R MLO synth-2D]
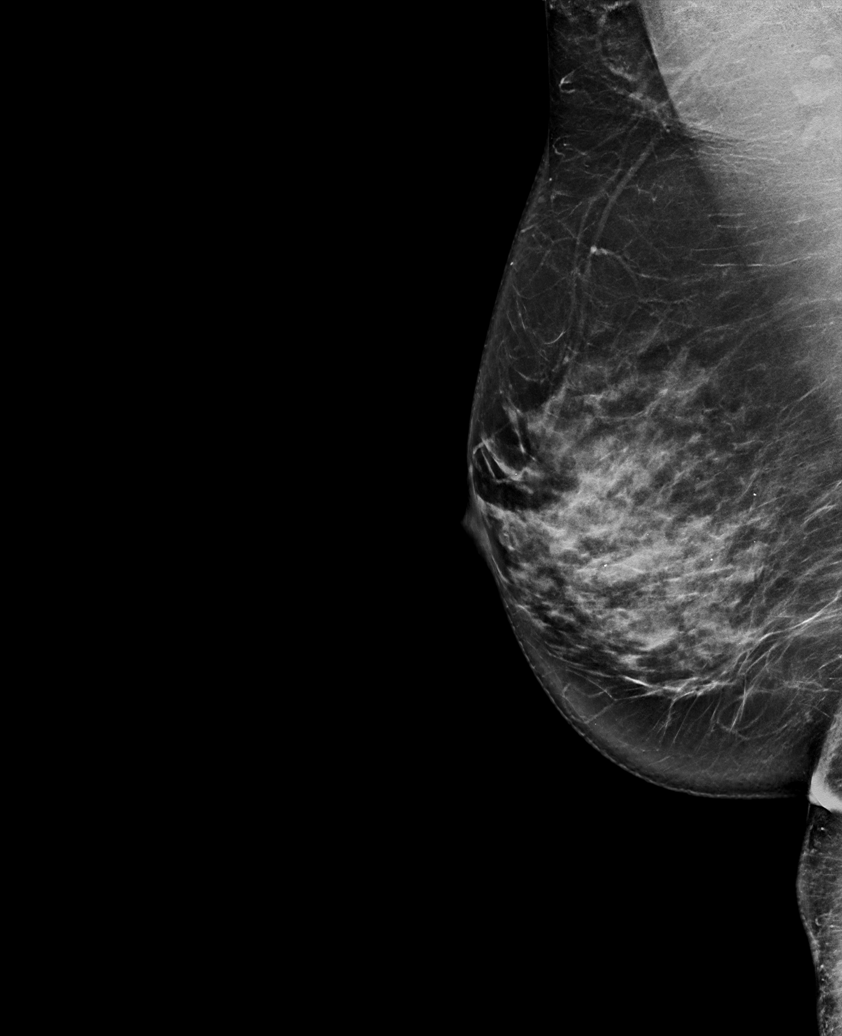

[R MLO tomo · tomo slice 43/84.0]
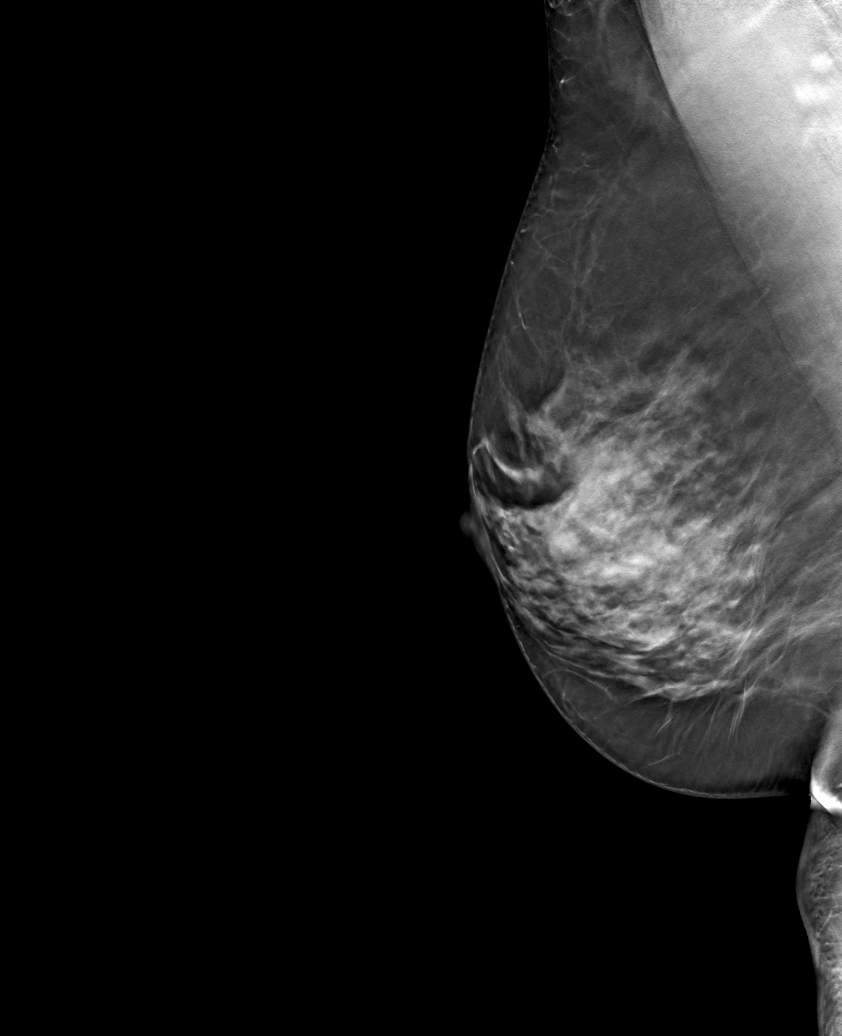

[6 of 30 positions shown; findings below may reference images not displayed]

ACR Breast Density Category c: The breast tissue is heterogeneously
dense, which may obscure small masses.
FINDINGS: There are no findings suspicious for malignancy.
IMPRESSION: No mammographic evidence of malignancy. A result letter of this
screening mammogram will be mailed directly to the patient.

RECOMMENDATION:
Screening mammogram in one year. (Code:Q3-W-BC3)

BI-RADS CATEGORY  1: Negative.

## 2023-02-23 ENCOUNTER — Telehealth: Payer: Self-pay

## 2023-02-23 ENCOUNTER — Other Ambulatory Visit (HOSPITAL_COMMUNITY): Payer: Self-pay

## 2023-02-23 NOTE — Telephone Encounter (Signed)
Pharmacy Patient Advocate Encounter   Received notification from Patient Pharmacy that prior authorization for Aimovig 140MG /ML auto-injectors is required/requested.   Insurance verification completed.   The patient is insured through [facility] .   Per test claim: PA required; PA submitted to above mentioned insurance via CoverMyMeds Key/confirmation #/EOC [id number] Status is pending

## 2023-02-27 ENCOUNTER — Other Ambulatory Visit (HOSPITAL_COMMUNITY): Payer: Self-pay

## 2023-02-27 NOTE — Telephone Encounter (Signed)
Pharmacy Patient Advocate Encounter  Received notification from [facility] that Prior Authorization for Aimovig 140MG /ML auto-injectors has been APPROVED from 02/23/2023 to 08/23/2023. Ran test claim, Copay is $0. This test claim was processed through [facility]- copay amounts may vary at other pharmacies due to pharmacy/plan contracts, or as the patient moves through the different stages of their insurance plan.   PA #/Case ID/Reference #: PA Case ID #: [id number]

## 2023-03-06 ENCOUNTER — Other Ambulatory Visit: Payer: Self-pay

## 2023-03-06 ENCOUNTER — Other Ambulatory Visit: Payer: Self-pay | Admitting: Cardiology

## 2023-03-06 ENCOUNTER — Telehealth: Payer: Self-pay | Admitting: Internal Medicine

## 2023-03-06 DIAGNOSIS — E119 Type 2 diabetes mellitus without complications: Secondary | ICD-10-CM

## 2023-03-06 DIAGNOSIS — E782 Mixed hyperlipidemia: Secondary | ICD-10-CM

## 2023-03-06 DIAGNOSIS — E039 Hypothyroidism, unspecified: Secondary | ICD-10-CM

## 2023-03-06 NOTE — Telephone Encounter (Signed)
Patient left VM wanting to stop by tomorrow and pick up printed lab orders to take to the location she has her labs done at. Please advise.

## 2023-03-11 ENCOUNTER — Other Ambulatory Visit: Payer: Self-pay | Admitting: Cardiology

## 2023-03-13 ENCOUNTER — Encounter: Payer: Self-pay | Admitting: Cardiology

## 2023-03-13 ENCOUNTER — Ambulatory Visit (INDEPENDENT_AMBULATORY_CARE_PROVIDER_SITE_OTHER): Payer: Managed Care, Other (non HMO) | Admitting: Cardiology

## 2023-03-13 ENCOUNTER — Other Ambulatory Visit: Payer: Managed Care, Other (non HMO)

## 2023-03-13 VITALS — BP 111/71 | HR 88 | Ht 66.0 in | Wt 171.8 lb

## 2023-03-13 DIAGNOSIS — E119 Type 2 diabetes mellitus without complications: Secondary | ICD-10-CM

## 2023-03-13 DIAGNOSIS — E039 Hypothyroidism, unspecified: Secondary | ICD-10-CM | POA: Diagnosis not present

## 2023-03-13 DIAGNOSIS — E782 Mixed hyperlipidemia: Secondary | ICD-10-CM | POA: Diagnosis not present

## 2023-03-13 DIAGNOSIS — I152 Hypertension secondary to endocrine disorders: Secondary | ICD-10-CM | POA: Insufficient documentation

## 2023-03-13 DIAGNOSIS — I10 Essential (primary) hypertension: Secondary | ICD-10-CM | POA: Diagnosis not present

## 2023-03-13 LAB — POCT CBG (FASTING - GLUCOSE)-MANUAL ENTRY: Glucose Fasting, POC: 193 mg/dL — AB (ref 70–99)

## 2023-03-13 NOTE — Progress Notes (Signed)
Established Patient Office Visit  Subjective:  Patient ID: Lauren Rodriguez, female    DOB: [birth year]  Age: 62 y.o. MRN: [id number]  Chief Complaint  Patient presents with   Follow-up    Patient in office for 3 month follow up. Patient doing well, no complaints.  Patient did not have blood work done today. Will do fasting labs today.     No other concerns at this time.   Past Medical History:  Diagnosis Date   Diabetes mellitus without complication (HCC)    Hyperlipidemia    Hypertension    Migraines    Thyroid disease     Past Surgical History:  Procedure Laterality Date   ABDOMINAL HYSTERECTOMY      Social History   Socioeconomic History   Marital status: Single    Spouse name: Not on file   Number of children: Not on file   Years of education: Not on file   Highest education level: Not on file  Occupational History   Not on file  Tobacco Use   Smoking status: Never   Smokeless tobacco: Never  Substance and Sexual Activity   Alcohol use: Yes    Alcohol/week: 2.0 standard drinks of alcohol    Types: 2 Glasses of wine per week   Drug use: Not on file   Sexual activity: Not on file  Other Topics Concern   Not on file  Social History Narrative   No caffeine use    Social Determinants of Health   Financial Resource Strain: Not on file  Food Insecurity: Not on file  Transportation Needs: Not on file  Physical Activity: Not on file  Stress: Not on file  Social Connections: Not on file  Intimate Partner Violence: Not on file    Family History  Problem Relation Age of Onset   Breast cancer Neg Hx     Allergies  Allergen Reactions   Other Hives and Swelling    Chocolate flavoring    Outpatient Medications Prior to Visit  Medication Sig   AIMOVIG 140 MG/ML SOAJ Inject 140 mg into the skin every 30 (thirty) days.   empagliflozin (JARDIANCE) 25 MG TABS tablet TAKE 1 TABLET BY MOUTH EVERY MORNING   Finerenone (KERENDIA) 20 MG TABS Take 1  tablet (20 mg total) by mouth daily.   levothyroxine (SYNTHROID) 125 MCG tablet Take 1 tablet (125 mcg total) by mouth daily.   lisinopril (ZESTRIL) 5 MG tablet Take 5 mg by mouth daily.   rosuvastatin (CRESTOR) 40 MG tablet Take 1 tablet (40 mg total) by mouth daily.   Ubrogepant (UBRELVY) 100 MG TABS Take 1 tablet (100 mg total) by mouth as needed. May repeat a dose in 2 hours if headache persists. Max dose 2 pills in 24 hours   No facility-administered medications prior to visit.    Review of Systems  Constitutional: Negative.   HENT: Negative.    Eyes: Negative.   Respiratory: Negative.  Negative for shortness of breath.   Cardiovascular: Negative.  Negative for chest pain.  Gastrointestinal: Negative.  Negative for abdominal pain, constipation and diarrhea.  Genitourinary: Negative.   Musculoskeletal:  Negative for joint pain and myalgias.  Skin: Negative.   Neurological: Negative.  Negative for dizziness and headaches.  Endo/Heme/Allergies: Negative.   All other systems reviewed and are negative.      Objective:   BP 111/71   Pulse 88   Ht 5\' 6"  (1.676 m)   Wt 171 lb 12.8 oz (77.9  kg)   SpO2 98%   BMI 27.73 kg/m   Vitals:   03/13/23 0942  BP: 111/71  Pulse: 88  Height: 5\' 6"  (1.676 m)  Weight: 171 lb 12.8 oz (77.9 kg)  SpO2: 98%  BMI (Calculated): 27.74    Physical Exam Vitals and nursing note reviewed.  Constitutional:      Appearance: Normal appearance. She is normal weight.  HENT:     Head: Normocephalic and atraumatic.     Nose: Nose normal.     Mouth/Throat:     Mouth: Mucous membranes are moist.  Eyes:     Extraocular Movements: Extraocular movements intact.     Conjunctiva/sclera: Conjunctivae normal.     Pupils: Pupils are equal, round, and reactive to light.  Cardiovascular:     Rate and Rhythm: Normal rate and regular rhythm.     Pulses: Normal pulses.     Heart sounds: Normal heart sounds.  Pulmonary:     Effort: Pulmonary effort is  normal.     Breath sounds: Normal breath sounds.  Abdominal:     General: Abdomen is flat. Bowel sounds are normal.     Palpations: Abdomen is soft.  Musculoskeletal:        General: Normal range of motion.     Cervical back: Normal range of motion.  Skin:    General: Skin is warm and dry.  Neurological:     General: No focal deficit present.     Mental Status: She is alert and oriented to person, place, and time.  Psychiatric:        Mood and Affect: Mood normal.        Behavior: Behavior normal.        Thought Content: Thought content normal.        Judgment: Judgment normal.      Results for orders placed or performed in visit on 03/13/23  POCT CBG (Fasting - Glucose)  Result Value Ref Range   Glucose Fasting, POC 193 (A) 70 - 99 mg/dL    Recent Results (from the past 2160 hour(s))  POCT CBG (Fasting - Glucose)     Status: Abnormal   Collection Time: 03/13/23  9:47 AM  Result Value Ref Range   Glucose Fasting, POC 193 (A) 70 - 99 mg/dL      Assessment & Plan:  Fasting lab work today.   Problem List Items Addressed This Visit       Cardiovascular and Mediastinum   Primary hypertension     Endocrine   Controlled type 2 diabetes mellitus without complication, without long-term current use of insulin (HCC) - Primary   Relevant Orders   POCT CBG (Fasting - Glucose) (Completed)   Acquired hypothyroidism     Other   Mixed hyperlipidemia    No follow-ups on file.   Total time spent: 25 minutes  Google, NP  03/13/2023   This document may have been prepared by Dragon Voice Recognition software and as such may include unintentional dictation errors.

## 2023-03-14 LAB — CMP14+EGFR
ALT: 14 [IU]/L (ref 0–32)
AST: 20 [IU]/L (ref 0–40)
Albumin: 4.2 g/dL (ref 3.8–4.9)
Alkaline Phosphatase: 96 [IU]/L (ref 44–121)
BUN/Creatinine Ratio: 27 (ref 12–28)
BUN: 20 mg/dL (ref 8–27)
Bilirubin Total: 0.5 mg/dL (ref 0.0–1.2)
CO2: 24 mmol/L (ref 20–29)
Calcium: 9.4 mg/dL (ref 8.7–10.3)
Chloride: 104 mmol/L (ref 96–106)
Creatinine, Ser: 0.73 mg/dL (ref 0.57–1.00)
Globulin, Total: 2.6 g/dL (ref 1.5–4.5)
Glucose: 107 mg/dL — ABNORMAL HIGH (ref 70–99)
Potassium: 4.2 mmol/L (ref 3.5–5.2)
Sodium: 141 mmol/L (ref 134–144)
Total Protein: 6.8 g/dL (ref 6.0–8.5)
eGFR: 94 mL/min/{1.73_m2} (ref >59)

## 2023-03-14 LAB — TSH: TSH: 1.71 u[IU]/mL (ref 0.450–4.500)

## 2023-03-14 LAB — LIPID PANEL
Chol/HDL Ratio: 2.5 {ratio} (ref 0.0–4.4)
Cholesterol, Total: 168 mg/dL (ref 100–199)
HDL: 67 mg/dL (ref >39)
LDL Chol Calc (NIH): 87 mg/dL (ref 0–99)
Triglycerides: 74 mg/dL (ref 0–149)
VLDL Cholesterol Cal: 14 mg/dL (ref 5–40)

## 2023-03-14 LAB — HEMOGLOBIN A1C
Est. average glucose Bld gHb Est-mCnc: 148 mg/dL
Hgb A1c MFr Bld: 6.8 % — ABNORMAL HIGH (ref 4.8–5.6)

## 2023-04-05 ENCOUNTER — Other Ambulatory Visit: Payer: Self-pay | Admitting: Cardiology

## 2023-04-06 MED ORDER — LISINOPRIL 5 MG PO TABS: 5.0000 mg | ORAL_TABLET | Freq: Every day | ORAL | 1 refills | Status: DC

## 2023-07-27 ENCOUNTER — Encounter: Payer: Self-pay | Admitting: Cardiology

## 2023-07-28 ENCOUNTER — Other Ambulatory Visit: Payer: Self-pay

## 2023-07-28 DIAGNOSIS — E039 Hypothyroidism, unspecified: Secondary | ICD-10-CM

## 2023-07-28 DIAGNOSIS — E782 Mixed hyperlipidemia: Secondary | ICD-10-CM

## 2023-07-28 DIAGNOSIS — I10 Essential (primary) hypertension: Secondary | ICD-10-CM

## 2023-07-28 DIAGNOSIS — E119 Type 2 diabetes mellitus without complications: Secondary | ICD-10-CM

## 2023-08-03 LAB — CMP14+EGFR
ALT: 11 IU/L (ref 0–32)
AST: 18 IU/L (ref 0–40)
Albumin: 3.9 g/dL (ref 3.9–4.9)
Alkaline Phosphatase: 85 IU/L (ref 44–121)
BUN/Creatinine Ratio: 35 — ABNORMAL HIGH (ref 12–28)
BUN: 21 mg/dL (ref 8–27)
Bilirubin Total: 0.3 mg/dL (ref 0.0–1.2)
CO2: 22 mmol/L (ref 20–29)
Calcium: 9.4 mg/dL (ref 8.7–10.3)
Chloride: 104 mmol/L (ref 96–106)
Creatinine, Ser: 0.6 mg/dL (ref 0.57–1.00)
Globulin, Total: 2.5 g/dL (ref 1.5–4.5)
Glucose: 97 mg/dL (ref 70–99)
Potassium: 4.7 mmol/L (ref 3.5–5.2)
Sodium: 140 mmol/L (ref 134–144)
Total Protein: 6.4 g/dL (ref 6.0–8.5)
eGFR: 102 mL/min/{1.73_m2} (ref >59)

## 2023-08-03 LAB — CBC WITH DIFF/PLATELET
Basophils Absolute: 0 10*3/uL (ref 0.0–0.2)
Basos: 1 %
EOS (ABSOLUTE): 0.2 10*3/uL (ref 0.0–0.4)
Eos: 4 %
Hematocrit: 38.1 % (ref 34.0–46.6)
Hemoglobin: 11.9 g/dL (ref 11.1–15.9)
Immature Grans (Abs): 0 10*3/uL (ref 0.0–0.1)
Immature Granulocytes: 0 %
Lymphocytes Absolute: 2.1 10*3/uL (ref 0.7–3.1)
Lymphs: 50 %
MCH: 28.5 pg (ref 26.6–33.0)
MCHC: 31.2 g/dL — ABNORMAL LOW (ref 31.5–35.7)
MCV: 91 fL (ref 79–97)
Monocytes Absolute: 0.3 10*3/uL (ref 0.1–0.9)
Monocytes: 8 %
Neutrophils Absolute: 1.6 10*3/uL (ref 1.4–7.0)
Neutrophils: 37 %
Platelets: 320 10*3/uL (ref 150–450)
RBC: 4.17 x10E6/uL (ref 3.77–5.28)
RDW: 12.4 % (ref 11.7–15.4)
WBC: 4.2 10*3/uL (ref 3.4–10.8)

## 2023-08-03 LAB — LIPID PANEL
Chol/HDL Ratio: 3 ratio (ref 0.0–4.4)
Cholesterol, Total: 180 mg/dL (ref 100–199)
HDL: 61 mg/dL (ref >39)
LDL Chol Calc (NIH): 108 mg/dL — ABNORMAL HIGH (ref 0–99)
Triglycerides: 58 mg/dL (ref 0–149)
VLDL Cholesterol Cal: 11 mg/dL (ref 5–40)

## 2023-08-03 LAB — TSH: TSH: 3.75 u[IU]/mL (ref 0.450–4.500)

## 2023-08-03 LAB — HEMOGLOBIN A1C
Est. average glucose Bld gHb Est-mCnc: 146 mg/dL
Hgb A1c MFr Bld: 6.7 % — ABNORMAL HIGH (ref 4.8–5.6)

## 2023-08-07 ENCOUNTER — Encounter: Payer: Self-pay | Admitting: Cardiology

## 2023-08-07 ENCOUNTER — Ambulatory Visit (INDEPENDENT_AMBULATORY_CARE_PROVIDER_SITE_OTHER): Admitting: Cardiology

## 2023-08-07 VITALS — BP 122/78 | HR 63 | Ht 66.0 in | Wt 178.2 lb

## 2023-08-07 DIAGNOSIS — E782 Mixed hyperlipidemia: Secondary | ICD-10-CM | POA: Diagnosis not present

## 2023-08-07 DIAGNOSIS — I10 Essential (primary) hypertension: Secondary | ICD-10-CM | POA: Diagnosis not present

## 2023-08-07 DIAGNOSIS — E039 Hypothyroidism, unspecified: Secondary | ICD-10-CM

## 2023-08-07 DIAGNOSIS — R5383 Other fatigue: Secondary | ICD-10-CM

## 2023-08-07 DIAGNOSIS — E119 Type 2 diabetes mellitus without complications: Secondary | ICD-10-CM | POA: Diagnosis not present

## 2023-08-07 NOTE — Progress Notes (Signed)
 Established Patient Office Visit  Subjective:  Patient ID: Lauren Rodriguez, female    DOB: 25-Nov-1960  Age: 63 y.o. MRN: [id number]  Chief Complaint  Patient presents with   Follow-up    Lab work and fmla document     Patient in office for regular follow up, discuss recent lab work. Patient doing well, complains of fatigue. CBC and TSH normal, will check a vitamin d  and vitamin B12. Hgb A1c stable, LDL elevated, admits to not taking statin daily.     No other concerns at this time.   Past Medical History:  Diagnosis Date   Diabetes mellitus without complication (HCC)    Hyperlipidemia    Hypertension    Migraines    Thyroid disease     Past Surgical History:  Procedure Laterality Date   ABDOMINAL HYSTERECTOMY      Social History   Socioeconomic History   Marital status: Single    Spouse name: Not on file   Number of children: Not on file   Years of education: Not on file   Highest education level: Not on file  Occupational History   Not on file  Tobacco Use   Smoking status: Never   Smokeless tobacco: Never  Substance and Sexual Activity   Alcohol use: Yes    Alcohol/week: 2.0 standard drinks of alcohol    Types: 2 Glasses of wine per week   Drug use: Not on file   Sexual activity: Not on file  Other Topics Concern   Not on file  Social History Narrative   No caffeine use    Social Drivers of Corporate investment banker Strain: Not on file  Food Insecurity: Not on file  Transportation Needs: Not on file  Physical Activity: Not on file  Stress: Not on file  Social Connections: Not on file  Intimate Partner Violence: Not on file    Family History  Problem Relation Age of Onset   Breast cancer Neg Hx     Allergies  Allergen Reactions   Other Hives and Swelling    Chocolate flavoring    Outpatient Medications Prior to Visit  Medication Sig   AIMOVIG  140 MG/ML SOAJ Inject 140 mg into the skin every 30 (thirty) days.   empagliflozin  (JARDIANCE) 25 MG TABS tablet TAKE 1 TABLET BY MOUTH EVERY MORNING   Finerenone  (KERENDIA ) 20 MG TABS Take 1 tablet (20 mg total) by mouth daily.   levothyroxine  (SYNTHROID ) 125 MCG tablet Take 1 tablet (125 mcg total) by mouth daily.   lisinopril  (ZESTRIL ) 5 MG tablet Take 1 tablet (5 mg total) by mouth daily.   rosuvastatin  (CRESTOR ) 40 MG tablet TAKE 1 TABLET(40 MG) BY MOUTH DAILY   Ubrogepant  (UBRELVY ) 100 MG TABS Take 1 tablet (100 mg total) by mouth as needed. May repeat a dose in 2 hours if headache persists. Max dose 2 pills in 24 hours   No facility-administered medications prior to visit.    Review of Systems  Constitutional: Negative.   HENT: Negative.    Eyes: Negative.   Respiratory: Negative.  Negative for shortness of breath.   Cardiovascular: Negative.  Negative for chest pain.  Gastrointestinal: Negative.  Negative for abdominal pain, constipation and diarrhea.  Genitourinary: Negative.   Musculoskeletal:  Negative for joint pain and myalgias.  Skin: Negative.   Neurological: Negative.  Negative for dizziness and headaches.  Endo/Heme/Allergies: Negative.   All other systems reviewed and are negative.      Objective:  BP 122/78   Pulse 63   Ht 5\' 6"  (1.676 m)   Wt 178 lb 3.2 oz (80.8 kg)   SpO2 94%   BMI 28.76 kg/m   Vitals:   08/07/23 1318  BP: 122/78  Pulse: 63  Height: 5\' 6"  (1.676 m)  Weight: 178 lb 3.2 oz (80.8 kg)  SpO2: 94%  BMI (Calculated): 28.78    Physical Exam Vitals and nursing note reviewed.  Constitutional:      Appearance: Normal appearance. She is normal weight.  HENT:     Head: Normocephalic and atraumatic.     Nose: Nose normal.     Mouth/Throat:     Mouth: Mucous membranes are moist.  Eyes:     Extraocular Movements: Extraocular movements intact.     Conjunctiva/sclera: Conjunctivae normal.     Pupils: Pupils are equal, round, and reactive to light.  Cardiovascular:     Rate and Rhythm: Normal rate and regular rhythm.      Pulses: Normal pulses.     Heart sounds: Normal heart sounds.  Pulmonary:     Effort: Pulmonary effort is normal.     Breath sounds: Normal breath sounds.  Abdominal:     General: Abdomen is flat. Bowel sounds are normal.     Palpations: Abdomen is soft.  Musculoskeletal:        General: Normal range of motion.     Cervical back: Normal range of motion.  Skin:    General: Skin is warm and dry.  Neurological:     General: No focal deficit present.     Mental Status: She is alert and oriented to person, place, and time.  Psychiatric:        Mood and Affect: Mood normal.        Behavior: Behavior normal.        Thought Content: Thought content normal.        Judgment: Judgment normal.      No results found for any visits on 08/07/23.  Recent Results (from the past 2160 hours)  Hemoglobin A1c     Status: Abnormal   Collection Time: 08/02/23  8:42 AM  Result Value Ref Range   Hgb A1c MFr Bld 6.7 (H) 4.8 - 5.6 %    Comment:          Prediabetes: 5.7 - 6.4          Diabetes: >6.4          Glycemic control for adults with diabetes: <7.0    Est. average glucose Bld gHb Est-mCnc 146 mg/dL  TSH     Status: None   Collection Time: 08/02/23  8:42 AM  Result Value Ref Range   TSH 3.750 0.450 - 4.500 uIU/mL  CMP14+EGFR     Status: Abnormal   Collection Time: 08/02/23  8:42 AM  Result Value Ref Range   Glucose 97 70 - 99 mg/dL   BUN 21 8 - 27 mg/dL   Creatinine, Ser 1.91 0.57 - 1.00 mg/dL   eGFR 478 >29 FA/OZH/0.86   BUN/Creatinine Ratio 35 (H) 12 - 28   Sodium 140 134 - 144 mmol/L   Potassium 4.7 3.5 - 5.2 mmol/L   Chloride 104 96 - 106 mmol/L   CO2 22 20 - 29 mmol/L   Calcium  9.4 8.7 - 10.3 mg/dL   Total Protein 6.4 6.0 - 8.5 g/dL   Albumin 3.9 3.9 - 4.9 g/dL   Globulin, Total 2.5 1.5 - 4.5 g/dL  Bilirubin Total 0.3 0.0 - 1.2 mg/dL   Alkaline Phosphatase 85 44 - 121 IU/L   AST 18 0 - 40 IU/L   ALT 11 0 - 32 IU/L  Lipid panel     Status: Abnormal   Collection  Time: 08/02/23  8:42 AM  Result Value Ref Range   Cholesterol, Total 180 100 - 199 mg/dL   Triglycerides 58 0 - 149 mg/dL   HDL 61 >16 mg/dL   VLDL Cholesterol Cal 11 5 - 40 mg/dL   LDL Chol Calc (NIH) 109 (H) 0 - 99 mg/dL   Chol/HDL Ratio 3.0 0.0 - 4.4 ratio    Comment:                                   T. Chol/HDL Ratio                                             Men  Women                               1/2 Avg.Risk  3.4    3.3                                   Avg.Risk  5.0    4.4                                2X Avg.Risk  9.6    7.1                                3X Avg.Risk 23.4   11.0   CBC With Diff/Platelet     Status: Abnormal   Collection Time: 08/02/23  8:42 AM  Result Value Ref Range   WBC 4.2 3.4 - 10.8 x10E3/uL   RBC 4.17 3.77 - 5.28 x10E6/uL   Hemoglobin 11.9 11.1 - 15.9 g/dL   Hematocrit 60.4 54.0 - 46.6 %   MCV 91 79 - 97 fL   MCH 28.5 26.6 - 33.0 pg   MCHC 31.2 (L) 31.5 - 35.7 g/dL   RDW 98.1 19.1 - 47.8 %   Platelets 320 150 - 450 x10E3/uL   Neutrophils 37 Not Estab. %   Lymphs 50 Not Estab. %   Monocytes 8 Not Estab. %   Eos 4 Not Estab. %   Basos 1 Not Estab. %   Neutrophils Absolute 1.6 1.4 - 7.0 x10E3/uL   Lymphocytes Absolute 2.1 0.7 - 3.1 x10E3/uL   Monocytes Absolute 0.3 0.1 - 0.9 x10E3/uL   EOS (ABSOLUTE) 0.2 0.0 - 0.4 x10E3/uL   Basophils Absolute 0.0 0.0 - 0.2 x10E3/uL   Immature Granulocytes 0 Not Estab. %   Immature Grans (Abs) 0.0 0.0 - 0.1 x10E3/uL      Assessment & Plan:  Check vitamin D  and B12 today. Take statin daily.  Problem List Items Addressed This Visit       Cardiovascular and Mediastinum   Primary hypertension - Primary     Endocrine   Controlled type 2 diabetes mellitus without  complication, without long-term current use of insulin (HCC)   Acquired hypothyroidism     Other   Mixed hyperlipidemia   Other Visit Diagnoses       Other fatigue       Relevant Orders   Vitamin D  (25 hydroxy)   Vitamin B12        Return in about 6 months (around 02/06/2024) for woth fasting labs prior.   Total time spent: 25 minutes  Google, NP  08/07/2023   This document may have been prepared by Dragon Voice Recognition software and as such may include unintentional dictation errors.

## 2023-08-08 ENCOUNTER — Encounter: Payer: Self-pay | Admitting: Nurse Practitioner

## 2023-08-08 ENCOUNTER — Ambulatory Visit: Payer: Self-pay | Admitting: Nurse Practitioner

## 2023-08-08 VITALS — BP 120/80 | HR 87 | Temp 98.2°F | Ht 66.0 in | Wt 179.0 lb

## 2023-08-08 DIAGNOSIS — E039 Hypothyroidism, unspecified: Secondary | ICD-10-CM

## 2023-08-08 DIAGNOSIS — Z6828 Body mass index (BMI) 28.0-28.9, adult: Secondary | ICD-10-CM

## 2023-08-08 DIAGNOSIS — E1169 Type 2 diabetes mellitus with other specified complication: Secondary | ICD-10-CM

## 2023-08-08 DIAGNOSIS — E782 Mixed hyperlipidemia: Secondary | ICD-10-CM | POA: Diagnosis not present

## 2023-08-08 DIAGNOSIS — E663 Overweight: Secondary | ICD-10-CM

## 2023-08-08 DIAGNOSIS — I10 Essential (primary) hypertension: Secondary | ICD-10-CM | POA: Diagnosis not present

## 2023-08-08 DIAGNOSIS — Z2821 Immunization not carried out because of patient refusal: Secondary | ICD-10-CM

## 2023-08-08 DIAGNOSIS — E119 Type 2 diabetes mellitus without complications: Secondary | ICD-10-CM

## 2023-08-08 DIAGNOSIS — Z7689 Persons encountering health services in other specified circumstances: Secondary | ICD-10-CM

## 2023-08-08 LAB — VITAMIN B12: Vitamin B-12: 816 pg/mL (ref 232–1245)

## 2023-08-08 LAB — VITAMIN D 25 HYDROXY (VIT D DEFICIENCY, FRACTURES): Vit D, 25-Hydroxy: 33.7 ng/mL (ref 30.0–100.0)

## 2023-08-08 NOTE — Progress Notes (Signed)
 [place], CMA,acting as a Neurosurgeon for [name], FNP.,have documented all relevant documentation on the behalf of [name], FNP,as directed by  [name], FNP while in the presence of [name], FNP.  Subjective:  Patient ID: Lauren Rodriguez , female    DOB: [birth year] , 63 y.o.   MRN: [id number]  Chief Complaint  Patient presents with   Establish Care    HPI  Patient presents today to establish care. She works at [employer]. She is widowed. No children. She was in the [employer] - 21 years. Patient reports compliance with medication. Patient denies any chest pain, SOB, or headaches. Patient has no concerns today.   PMH - diabetes since 2013, she is taking Jardiance, she is also taking a statin. She is also on lisinopril  for kidney protection for proteinuria. Migraine - ubrelvy  was on emgality  until insurance stopped covering. She is now on Ajovy  but only had twice has appt next week with Neurology. Hypothyroid.   She had a hysterectomy while at [facility]. She is unsure if she had her ovaries removed.   Patient last saw her old PCP yesterday- she reports her previous doctor has moved, and the doctor she is seeing now is temporary.  Patient reports she does not have hypertension- the medication she was giving was to protect her kidneys.  Patient had a hysterotomy in 2001       Past Medical History:  Diagnosis Date   Diabetes mellitus without complication (HCC)    Hyperlipidemia    Hypertension    Migraines    Thyroid disease      Family History  Problem Relation Age of Onset   Heart disease Father    Breast cancer Neg Hx      Current Outpatient Medications:    empagliflozin (JARDIANCE) 25 MG TABS tablet, TAKE 1 TABLET BY MOUTH EVERY MORNING, Disp: 90 tablet, Rfl: 3   Finerenone  (KERENDIA ) 20 MG TABS, Take 1 tablet (20 mg total) by mouth daily., Disp: 30 tablet, Rfl: 5   levothyroxine  (SYNTHROID ) 125 MCG tablet, Take 1 tablet (125 mcg total) by  mouth daily., Disp: 30 tablet, Rfl: 11   lisinopril  (ZESTRIL ) 5 MG tablet, Take 1 tablet (5 mg total) by mouth daily., Disp: 90 tablet, Rfl: 1   rosuvastatin  (CRESTOR ) 40 MG tablet, TAKE 1 TABLET(40 MG) BY MOUTH DAILY, Disp: 90 tablet, Rfl: 0   Ubrogepant  (UBRELVY ) 100 MG TABS, Take 1 tablet (100 mg total) by mouth as needed. May repeat a dose in 2 hours if headache persists. Max dose 2 pills in 24 hours, Disp: 16 tablet, Rfl: 5   AIMOVIG  140 MG/ML SOAJ, Inject 140 mg into the skin every 30 (thirty) days. (Patient not taking: Reported on 08/08/2023), Disp: 1 mL, Rfl: 11   Allergies  Allergen Reactions   Other Hives and Swelling    Chocolate flavoring     Review of Systems  Constitutional: Negative.   Respiratory: Negative.    Cardiovascular: Negative.   Gastrointestinal: Negative.   Neurological: Negative.   Psychiatric/Behavioral: Negative.       Today's Vitals   08/08/23 1105  BP: 120/80  Pulse: 87  Temp: 98.2 F (36.8 C)  TempSrc: Oral  Weight: 179 lb (81.2 kg)  Height: 5\' 6"  (1.676 m)  PainSc: 0-No pain   Body mass index is 28.89 kg/m.  Wt Readings from Last 3 Encounters:  08/08/23 179 lb (81.2 kg)  08/07/23 178 lb 3.2 oz (80.8 kg)  03/13/23  171 lb 12.8 oz (77.9 kg)      Objective:  Physical Exam Vitals reviewed.  Constitutional:      General: She is not in acute distress.    Appearance: Normal appearance. She is well-developed.  HENT:     Head: Normocephalic and atraumatic.  Eyes:     Pupils: Pupils are equal, round, and reactive to light.  Cardiovascular:     Rate and Rhythm: Normal rate and regular rhythm.     Pulses: Normal pulses.     Heart sounds: Normal heart sounds. No murmur heard. Pulmonary:     Effort: Pulmonary effort is normal. No respiratory distress.     Breath sounds: Normal breath sounds. No wheezing.  Musculoskeletal:        General: Normal range of motion.  Skin:    General: Skin is warm and dry.     Capillary Refill: Capillary  refill takes less than 2 seconds.  Neurological:     General: No focal deficit present.     Mental Status: She is alert and oriented to person, place, and time.     Cranial Nerves: No cranial nerve deficit.     Motor: No weakness.  Psychiatric:        Mood and Affect: Mood normal.         Assessment And Plan:  Establishing care with new doctor, encounter for Assessment & Plan: Patient is here to establish care. Went over patient medical, family, social and surgical history. Reviewed with patient their medications and any allergies  Reviewed with patient their sexual orientation, drug/tobacco and alcohol use Dicussed any new concerns with patient  recommended patient comes in for a physical exam and complete blood work.  Educated patient about the importance of annual screenings and immunizations.  Advised patient to eat a healthy diet along with exercise for atleast 30-45 min at least 4-5 days of the week.     Primary hypertension Assessment & Plan: Blood pressure is controlled, continue current medications.    Mixed hyperlipidemia Assessment & Plan: She is taking a statin, tolerating well   Acquired hypothyroidism Assessment & Plan: She is taking levothyroxine , will check thyroid levels.   Controlled type 2 diabetes mellitus without complication, without long-term current use of insulin (HCC) Assessment & Plan: She is taking Jardiance, will check HgbA1c  Orders: -     EKG 12-Lead  Tetanus, diphtheria, and acellular pertussis (Tdap) vaccination declined  COVID-19 vaccination declined  Herpes zoster vaccination declined  Overweight with body mass index (BMI) of 28 to 28.9 in adult    Return in about 4 months (around 12/08/2023) for phy when able. .  Patient was given opportunity to ask questions. Patient verbalized understanding of the plan and was able to repeat key elements of the plan. All questions were answered to their satisfaction.    Inge Mangle, FNP, have reviewed all documentation for this visit. The documentation on 08/08/23 for the exam, diagnosis, procedures, and orders are all accurate and complete.   IF YOU HAVE BEEN REFERRED TO A SPECIALIST, IT MAY TAKE 1-2 WEEKS TO SCHEDULE/PROCESS THE REFERRAL. IF YOU HAVE NOT HEARD FROM US /SPECIALIST IN TWO WEEKS, PLEASE GIVE US  A CALL AT [phone].

## 2023-08-14 ENCOUNTER — Telehealth: Payer: Self-pay | Admitting: Adult Health

## 2023-08-14 NOTE — Telephone Encounter (Signed)
 Reschedule appointment due to work scheduled conflict.

## 2023-08-16 ENCOUNTER — Ambulatory Visit: Payer: Managed Care, Other (non HMO) | Admitting: Adult Health

## 2023-08-21 NOTE — Assessment & Plan Note (Signed)
 She is taking levothyroxine , will check thyroid levels.

## 2023-08-21 NOTE — Assessment & Plan Note (Signed)
 She is taking a statin, tolerating well

## 2023-08-21 NOTE — Assessment & Plan Note (Signed)

## 2023-08-21 NOTE — Assessment & Plan Note (Signed)
 She is taking Jardiance, will check HgbA1c

## 2023-08-21 NOTE — Assessment & Plan Note (Signed)
 Blood pressure is controlled, continue current medications

## 2023-08-28 ENCOUNTER — Other Ambulatory Visit (HOSPITAL_COMMUNITY): Payer: Self-pay

## 2023-08-28 ENCOUNTER — Telehealth: Payer: Self-pay | Admitting: Pharmacy Technician

## 2023-08-28 NOTE — Telephone Encounter (Signed)
 Pharmacy Patient Advocate Encounter   Received notification from CoverMyMeds that prior authorization for Ubrelvy  100MG  tablets is required/requested.   Insurance verification completed.   The patient is insured through [facility]   Per test claim: PA required; PA submitted to above mentioned insurance via CoverMyMeds Key/confirmation #/EOC [id number] Status is pending

## 2023-08-29 NOTE — Telephone Encounter (Signed)
 Pharmacy Patient Advocate Encounter  Received notification from [employer] that Prior Authorization for Ubrelvy  100MG  tablets  has been APPROVED from 08/28/2023 to 08/27/2025   PA #/Case ID/Reference #: [id number]

## 2023-10-25 ENCOUNTER — Other Ambulatory Visit: Payer: Self-pay

## 2023-10-26 ENCOUNTER — Other Ambulatory Visit: Payer: Self-pay

## 2023-12-06 ENCOUNTER — Encounter: Admitting: Nurse Practitioner

## 2023-12-14 ENCOUNTER — Ambulatory Visit (INDEPENDENT_AMBULATORY_CARE_PROVIDER_SITE_OTHER): Admitting: Cardiology

## 2023-12-14 ENCOUNTER — Encounter: Payer: Self-pay | Admitting: Cardiology

## 2023-12-14 VITALS — BP 128/82 | HR 81 | Ht 66.0 in | Wt 178.8 lb

## 2023-12-14 DIAGNOSIS — E039 Hypothyroidism, unspecified: Secondary | ICD-10-CM

## 2023-12-14 DIAGNOSIS — I10 Essential (primary) hypertension: Secondary | ICD-10-CM | POA: Diagnosis not present

## 2023-12-14 MED ORDER — KERENDIA 20 MG PO TABS
20.0000 mg | ORAL_TABLET | Freq: Every day | ORAL | 5 refills | Status: DC
Start: 2023-12-14 — End: 2024-01-04

## 2023-12-14 NOTE — Progress Notes (Signed)
 Established Patient Office Visit  Subjective:  Patient ID: Lauren Rodriguez, female    DOB: 1960-05-15  Age: 63 y.o. MRN: [id number]  Chief Complaint  Patient presents with   Follow-up    Meds     Patient in office for follow up, needs medication refills. Patient doing well, no complaints today. Had blood work done through work, no TSH or CMP done, will do today.  Continue same medications.     No other concerns at this time.   Past Medical History:  Diagnosis Date   Diabetes mellitus without complication (HCC)    Hyperlipidemia    Hypertension    Migraines    Thyroid disease     Past Surgical History:  Procedure Laterality Date   ABDOMINAL HYSTERECTOMY      Social History   Socioeconomic History   Marital status: Single    Spouse name: Not on file   Number of children: 0   Years of education: Not on file   Highest education level: Not on file  Occupational History   Not on file  Tobacco Use   Smoking status: Never   Smokeless tobacco: Never  Vaping Use   Vaping status: Never Used  Substance and Sexual Activity   Alcohol use: Yes    Alcohol/week: 2.0 standard drinks of alcohol    Types: 2 Glasses of wine per week   Drug use: Not Currently   Sexual activity: Not Currently  Other Topics Concern   Not on file  Social History Narrative   No caffeine use    Social Drivers of Corporate investment banker Strain: Not on file  Food Insecurity: Not on file  Transportation Needs: Not on file  Physical Activity: Not on file  Stress: Not on file  Social Connections: Not on file  Intimate Partner Violence: Not on file    Family History  Problem Relation Age of Onset   Heart disease Father    Breast cancer Neg Hx     Allergies  Allergen Reactions   Other Hives and Swelling    Chocolate flavoring    Outpatient Medications Prior to Visit  Medication Sig   empagliflozin (JARDIANCE) 25 MG TABS tablet TAKE 1 TABLET BY MOUTH EVERY MORNING    levothyroxine  (SYNTHROID ) 125 MCG tablet Take 1 tablet (125 mcg total) by mouth daily.   lisinopril  (ZESTRIL ) 5 MG tablet Take 1 tablet (5 mg total) by mouth daily.   rosuvastatin  (CRESTOR ) 40 MG tablet TAKE 1 TABLET(40 MG) BY MOUTH DAILY   Ubrogepant  (UBRELVY ) 100 MG TABS Take 1 tablet (100 mg total) by mouth as needed. May repeat a dose in 2 hours if headache persists. Max dose 2 pills in 24 hours   [DISCONTINUED] Finerenone  (KERENDIA ) 20 MG TABS Take 1 tablet (20 mg total) by mouth daily.   AIMOVIG  140 MG/ML SOAJ Inject 140 mg into the skin every 30 (thirty) days. (Patient not taking: Reported on 12/14/2023)   No facility-administered medications prior to visit.    Review of Systems  Constitutional: Negative.   HENT: Negative.    Eyes: Negative.   Respiratory: Negative.  Negative for shortness of breath.   Cardiovascular: Negative.  Negative for chest pain.  Gastrointestinal: Negative.  Negative for abdominal pain, constipation and diarrhea.  Genitourinary: Negative.   Musculoskeletal:  Negative for joint pain and myalgias.  Skin: Negative.   Neurological: Negative.  Negative for dizziness and headaches.  Endo/Heme/Allergies: Negative.   All other systems reviewed and are  negative.      Objective:   BP 128/82   Pulse 81   Ht 5' 6 (1.676 m)   Wt 178 lb 12.8 oz (81.1 kg)   SpO2 100%   BMI 28.86 kg/m   Vitals:   12/14/23 1517  BP: 128/82  Pulse: 81  Height: 5' 6 (1.676 m)  Weight: 178 lb 12.8 oz (81.1 kg)  SpO2: 100%  BMI (Calculated): 28.87    Physical Exam Vitals and nursing note reviewed.  Constitutional:      Appearance: Normal appearance. She is normal weight.  HENT:     Head: Normocephalic and atraumatic.     Nose: Nose normal.     Mouth/Throat:     Mouth: Mucous membranes are moist.  Eyes:     Extraocular Movements: Extraocular movements intact.     Conjunctiva/sclera: Conjunctivae normal.     Pupils: Pupils are equal, round, and reactive to light.   Cardiovascular:     Rate and Rhythm: Normal rate and regular rhythm.     Pulses: Normal pulses.     Heart sounds: Normal heart sounds.  Pulmonary:     Effort: Pulmonary effort is normal.     Breath sounds: Normal breath sounds.  Abdominal:     General: Abdomen is flat. Bowel sounds are normal.     Palpations: Abdomen is soft.  Musculoskeletal:        General: Normal range of motion.     Cervical back: Normal range of motion.  Skin:    General: Skin is warm and dry.  Neurological:     General: No focal deficit present.     Mental Status: She is alert and oriented to person, place, and time.  Psychiatric:        Mood and Affect: Mood normal.        Behavior: Behavior normal.        Thought Content: Thought content normal.        Judgment: Judgment normal.      No results found for any visits on 12/14/23.  No results found for this or any previous visit (from the past 2160 hours).    Assessment & Plan:  Blood work today Continue same medications  Problem List Items Addressed This Visit       Cardiovascular and Mediastinum   Primary hypertension - Primary   Relevant Orders   CMP14+EGFR     Endocrine   Acquired hypothyroidism   Relevant Orders   TSH    Return in about 4 months (around 04/14/2024) for fasting lab work prior.   Total time spent: 25 minutes  [employer], NP  12/14/2023   This document may have been prepared by Dragon Voice Recognition software and as such may include unintentional dictation errors.

## 2023-12-15 ENCOUNTER — Ambulatory Visit: Payer: Self-pay | Admitting: Cardiology

## 2023-12-15 LAB — CMP14+EGFR
ALT: 11 IU/L (ref 0–32)
AST: 17 IU/L (ref 0–40)
Albumin: 4.2 g/dL (ref 3.9–4.9)
Alkaline Phosphatase: 95 IU/L (ref 44–121)
BUN/Creatinine Ratio: 28 (ref 12–28)
BUN: 19 mg/dL (ref 8–27)
Bilirubin Total: 0.3 mg/dL (ref 0.0–1.2)
CO2: 20 mmol/L (ref 20–29)
Calcium: 9.5 mg/dL (ref 8.7–10.3)
Chloride: 100 mmol/L (ref 96–106)
Creatinine, Ser: 0.67 mg/dL (ref 0.57–1.00)
Globulin, Total: 2.8 g/dL (ref 1.5–4.5)
Glucose: 93 mg/dL (ref 70–99)
Potassium: 4.5 mmol/L (ref 3.5–5.2)
Sodium: 137 mmol/L (ref 134–144)
Total Protein: 7 g/dL (ref 6.0–8.5)
eGFR: 99 mL/min/1.73 (ref >59)

## 2023-12-15 LAB — TSH: TSH: 3.22 u[IU]/mL (ref 0.450–4.500)

## 2024-01-03 NOTE — Progress Notes (Unsigned)
 CC:  headaches No chief complaint on file.    Follow-up Visit  Last visit: 02/09/2023  Brief HPI: 64 year old female with a history of DM, hypothyroidism, proteinuria who follows in clinic for migraines. Brain MRI 07/22/21 showed chronic microvascular changes and was otherwise unremarkable.   At her last visit, mentioned difficulty obtaining Ajovy  and upon further review, it was denied by insurance and required trial of Aimovig , she continued on Ubrelvy  for rescue   Interval History:     Insurance continued to decline Emgality  therefore switched to Ajovy  but having difficulty getting approved. Per epic review, Ajovy  did not require a PA through insurance and is being covered but patient was told last week by her insurance company that they would not cover as there was not enough documentation.  She did receive 1 injection in June but none since that time.  She continues to have daily mild headaches, has at least one migraine per week and can last up to 2 days at a time. Reports Ubrelvy  typically helps for migraine rescue but also notes taking it daily. Due to migraine frequency, she has been missing work frequently.     Migraine days per month: 8 Headache free days per month: 22  Current Headache Regimen: Preventative: none Abortive: Ubrelvy  100 mg PRN   Prior Therapies                                  Prevention: Citalopram  Propranolol amitriptyline Lisinopril  5 mg daily Emgality  120 mg monthly - work but Automotive engineer point injections   Rescue: Imitrex Maxalt Zomig Ubrelvy  100 mg PRN Fioricet      Current Outpatient Medications on File Prior to Visit  Medication Sig Dispense Refill   AIMOVIG  140 MG/ML SOAJ Inject 140 mg into the skin every 30 (thirty) days. (Patient not taking: Reported on 12/14/2023) 1 mL 11   empagliflozin (JARDIANCE) 25 MG TABS tablet TAKE 1 TABLET BY MOUTH EVERY MORNING 90 tablet 3   Finerenone  (KERENDIA ) 20 MG TABS Take 1  tablet (20 mg total) by mouth daily. 30 tablet 5   levothyroxine  (SYNTHROID ) 125 MCG tablet Take 1 tablet (125 mcg total) by mouth daily. 30 tablet 11   lisinopril  (ZESTRIL ) 5 MG tablet Take 1 tablet (5 mg total) by mouth daily. 90 tablet 1   rosuvastatin  (CRESTOR ) 40 MG tablet TAKE 1 TABLET(40 MG) BY MOUTH DAILY 90 tablet 0   Ubrogepant  (UBRELVY ) 100 MG TABS Take 1 tablet (100 mg total) by mouth as needed. May repeat a dose in 2 hours if headache persists. Max dose 2 pills in 24 hours 16 tablet 5   No current facility-administered medications on file prior to visit.   Past Medical History:  Diagnosis Date   Diabetes mellitus without complication (HCC)    Hyperlipidemia    Hypertension    Migraines    Thyroid disease    Past Surgical History:  Procedure Laterality Date   ABDOMINAL HYSTERECTOMY            Physical Exam:   Vital Signs: There were no vitals taken for this visit. GENERAL:  well appearing, in no acute distress, alert  SKIN:  Color, texture, turgor normal. No rashes or lesions HEAD:  Normocephalic/atraumatic. RESP: normal respiratory effort MSK:  No gross joint deformities.   NEUROLOGICAL: Mental Status: Alert, oriented to person, place and time, Follows commands, and Speech fluent and  appropriate. Cranial Nerves: PERRL, face symmetric, no dysarthria, hearing grossly intact Motor: moves all extremities equally Gait: normal-based.      IMPRESSION: 63 year old female with a history of DM, hypothyroidism, proteinuria who presents for follow up of migraines.  Headaches initially improved on Emgality  but insurance stopped covering and required trial of Aimovig  or Ajovy  therefore switched to Ajovy .  She continues to have difficulty obtaining. Ubrelvy  for rescue but has been taking daily.    PLAN: -Preventive: needs to start Ajovy  - will look into reason this is showing being covered on our end but insurance is telling her they will not cover -Rescue: Continue  Ubrelvy  100 mg PRN, advised to take on an as needed basis  -Next steps: will need to fail Aimovig  if no benefit with Ajovy  prior to restarting Emgality      Follow up in 6 months or call earlier if needed     I personally spent a total of *** minutes in the care of the patient today including {Time Based Coding:210964241}.   Harlene Bogaert, AGNP-BC  [facility] [address] [place], [place] [address]  Phone [phone] Fax [phone] Note: This document was prepared with digital dictation and possible smart phrase technology. Any transcriptional errors that result from this process are unintentional.

## 2024-01-04 ENCOUNTER — Encounter: Payer: Self-pay | Admitting: Adult Health

## 2024-01-04 ENCOUNTER — Ambulatory Visit (INDEPENDENT_AMBULATORY_CARE_PROVIDER_SITE_OTHER): Admitting: Adult Health

## 2024-01-04 VITALS — BP 122/74 | HR 80 | Ht 66.0 in | Wt 179.0 lb

## 2024-01-04 DIAGNOSIS — R519 Headache, unspecified: Secondary | ICD-10-CM

## 2024-01-04 DIAGNOSIS — G43019 Migraine without aura, intractable, without status migrainosus: Secondary | ICD-10-CM

## 2024-01-04 MED ORDER — TOPIRAMATE 25 MG PO TABS: 25.0000 mg | ORAL_TABLET | Freq: Two times a day (BID) | ORAL | 11 refills | Status: AC

## 2024-01-04 MED ORDER — UBRELVY 100 MG PO TABS: 100.0000 mg | ORAL_TABLET | ORAL | 11 refills | Status: AC | PRN

## 2024-01-04 NOTE — Patient Instructions (Addendum)
 Your Plan:  Start topamax  25mg  twice daily for continued headaches - please call after 2-3 weeks if headaches persist to discuss dosage increase. Please call sooner if any difficulty tolerating.   Continue Ubrelvy  as needed - please only take for more severe migraine headaches  Try to limit over the counter pain relievers to reduce risk of rebound headache      Follow up in 6 months or call earlier if needed      Thank you for coming to see us  at [facility]. I hope we have been able to provide you high quality care today.  You may receive a patient satisfaction survey over the next few weeks. We would appreciate your feedback and comments so that we may continue to improve ourselves and the health of our patients.    Topiramate  Tablets What is this medication? TOPIRAMATE  (toe PYRE a mate) prevents and controls seizures in people with epilepsy. It may also be used to prevent migraine headaches. It works by calming overactive nerves in your body. This medicine may be used for other purposes; ask your health care provider or pharmacist if you have questions. COMMON BRAND NAME(S): Topamax , Topiragen What should I tell my care team before I take this medication? They need to know if you have any of these conditions: Bleeding disorder Kidney disease Lung disease Suicidal thoughts, plans, or attempt by you or a family member An unusual or allergic reaction to topiramate , other medications, foods, dyes, or preservatives Pregnant or trying to get pregnant Breast-feeding How should I use this medication? Take this medication by mouth with water. Take it as directed on the prescription label at the same time every day. Do not cut, crush or chew this medicine. Swallow the tablets whole. You can take it with or without food. If it upsets your stomach, take it with food. Keep taking it unless your care team tells you to stop. A special MedGuide will be given to you by  the pharmacist with each prescription and refill. Be sure to read this information carefully each time. Talk to your care team about the use of this medication in children. While it may be prescribed for children as young as 2 years for selected conditions, precautions do apply. Overdosage: If you think you have taken too much of this medicine contact a poison control center or emergency room at once. NOTE: This medicine is only for you. Do not share this medicine with others. What if I miss a dose? If you miss a dose, take it as soon as you can unless it is within 6 hours of the next dose. If it is within 6 hours of the next dose, skip the missed dose. Take the next dose at the normal time. Do not take double or extra doses. What may interact with this medication? Acetazolamide Alcohol Antihistamines for allergy, cough, and cold Aspirin and aspirin-like medications Atropine Certain medications for anxiety or sleep Certain medications for bladder problems, such as oxybutynin, tolterodine Certain medications for depression, such as amitriptyline, fluoxetine, sertraline Certain medications for Parkinson disease, such as benztropine, trihexyphenidyl Certain medications for seizures, such as carbamazepine, lamotrigine, phenobarbital, phenytoin, primidone, valproic acid, zonisamide Certain medications for stomach problems, such as dicyclomine, hyoscyamine Certain medications for travel sickness, such as scopolamine Certain medications that treat or prevent blood clots, such as warfarin, enoxaparin, dalteparin, apixaban, dabigatran, rivaroxaban Digoxin Diltiazem Estrogen and progestin hormones General anesthetics, such as halothane, isoflurane, methoxyflurane, propofol Glyburide Hydrochlorothiazide Ipratropium Lithium Medications that relax  muscles Metformin NSAIDs, medications for pain and inflammation, such as ibuprofen or naproxen Opioid medications for pain Phenothiazines, such as  chlorpromazine, mesoridazine, prochlorperazine, thioridazine Pioglitazone This list may not describe all possible interactions. Give your health care provider a list of all the medicines, herbs, non-prescription drugs, or dietary supplements you use. Also tell them if you smoke, drink alcohol, or use illegal drugs. Some items may interact with your medicine. What should I watch for while using this medication? Visit your care team for regular checks on your progress. Tell your care team if your symptoms do not start to get better or if they get worse. Do not suddenly stop taking this medication. You may develop a severe reaction. Your care team will tell you how much medication to take. If your care team wants you to stop the medication, the dose may be slowly lowered over time to avoid any side effects. Wear a medical ID bracelet or chain. Carry a card that describes your condition. List the medications and doses you take on the card. This medication may affect your coordination, reaction time, or judgment. Do not drive or operate machinery until you know how this medication affects you. Sit up or stand slowly to reduce the risk of dizzy or fainting spells. Drinking alcohol with this medication can increase the risk of these side effects. This medication may cause serious skin reactions. They can happen weeks to months after starting the medication. Contact your care team right away if you notice fevers or flu-like symptoms with a rash. The rash may be red or purple and then turn into blisters or peeling of the skin. You may also notice a red rash with swelling of the face, lips, or lymph nodes in your neck or under your arms. This medication may cause thoughts of suicide or depression. This includes sudden changes in mood, behaviors, or thoughts. These changes can happen at any time but are more common in the beginning of treatment or after a change in dose. Call your care team right away if you  experience these thoughts or worsening depression. This medication may slow your child's growth if it is taken for a long time at high doses. Your child's care team will monitor your child's growth. Using this medication for a long time may weaken your bones. The risk of bone fractures may be increased. Talk to your care team about your bone health. Discuss this medication with your care team if you may be pregnant. Serious birth defects can occur if you take this medication during pregnancy. There are benefits and risks to taking medications during pregnancy. Your care team can help you find the option that works for you. Contraception is recommended while taking this medication. Estrogen and progestin hormones may not work as well while you are taking this medication. Your care team can help you find the option that works for you. Talk to your care team before breastfeeding. Changes to your treatment plan may be needed. What side effects may I notice from receiving this medication? Side effects that you should report to your care team as soon as possible: Allergic reactions--skin rash, itching, hives, swelling of the face, lips, tongue, or throat High acid level--trouble breathing, unusual weakness or fatigue, confusion, headache, fast or irregular heartbeat, nausea, vomiting High ammonia level--unusual weakness or fatigue, confusion, loss of appetite, nausea, vomiting, seizures Fever that does not go away, decrease in sweat Kidney stones--blood in the urine, pain or trouble passing urine, pain in the lower  back or sides Redness, blistering, peeling or loosening of the skin, including inside the mouth Sudden eye pain or change in vision such as blurry vision, seeing halos around lights, vision loss Thoughts of suicide or self-harm, worsening mood, feelings of depression Side effects that usually do not require medical attention (report to your care team if they continue or are bothersome): Burning  or tingling sensation in hands or feet Difficulty with paying attention, memory, or speech Dizziness Drowsiness Fatigue Loss of appetite with weight loss Slow or sluggish movements of the body This list may not describe all possible side effects. Call your doctor for medical advice about side effects. You may report side effects to FDA at 1-800-FDA-1088. Where should I keep my medication? Keep out of the reach of children and pets. Store between 15 and 30 degrees C (59 and 86 degrees F). Protect from moisture. Keep the container tightly closed. Get rid of any unused medication after the expiration date. To get rid of medications that are no longer needed or have expired: Take the medication to a medication take-back program. Check with your pharmacy or law enforcement to find a location. If you cannot return the medication, check the label or package insert to see if the medication should be thrown out in the garbage or flushed down the toilet. If you are not sure, ask your care team. If it is safe to put it in the trash, empty the medication out of the container. Mix the medication with cat litter, dirt, coffee grounds, or other unwanted substance. Seal the mixture in a bag or container. Put it in the trash. NOTE: This sheet is a summary. It may not cover all possible information. If you have questions about this medicine, talk to your doctor, pharmacist, or health care provider.  2024 Elsevier/Gold Standard (2021-08-26 00:00:00)

## 2024-01-29 ENCOUNTER — Encounter: Payer: Self-pay | Admitting: Adult Health

## 2024-01-29 ENCOUNTER — Encounter: Payer: Self-pay | Admitting: Cardiology

## 2024-01-30 ENCOUNTER — Other Ambulatory Visit: Payer: Self-pay

## 2024-01-30 ENCOUNTER — Other Ambulatory Visit: Payer: Self-pay | Admitting: Cardiology

## 2024-01-30 DIAGNOSIS — I10 Essential (primary) hypertension: Secondary | ICD-10-CM

## 2024-01-30 DIAGNOSIS — E782 Mixed hyperlipidemia: Secondary | ICD-10-CM

## 2024-01-30 DIAGNOSIS — E119 Type 2 diabetes mellitus without complications: Secondary | ICD-10-CM

## 2024-01-30 DIAGNOSIS — E039 Hypothyroidism, unspecified: Secondary | ICD-10-CM

## 2024-02-06 ENCOUNTER — Ambulatory Visit: Payer: Self-pay | Admitting: Cardiology

## 2024-02-06 ENCOUNTER — Ambulatory Visit: Admitting: Cardiology

## 2024-02-06 LAB — LIPID PANEL
Chol/HDL Ratio: 4.1 ratio (ref 0.0–4.4)
Cholesterol, Total: 240 mg/dL — ABNORMAL HIGH (ref 100–199)
HDL: 58 mg/dL (ref >39)
LDL Chol Calc (NIH): 166 mg/dL — ABNORMAL HIGH (ref 0–99)
Triglycerides: 90 mg/dL (ref 0–149)
VLDL Cholesterol Cal: 16 mg/dL (ref 5–40)

## 2024-02-06 LAB — CMP14+EGFR
ALT: 12 IU/L (ref 0–32)
AST: 17 IU/L (ref 0–40)
Albumin: 4.1 g/dL (ref 3.9–4.9)
Alkaline Phosphatase: 90 IU/L (ref 49–135)
BUN/Creatinine Ratio: 40 — ABNORMAL HIGH (ref 12–28)
BUN: 29 mg/dL — ABNORMAL HIGH (ref 8–27)
Bilirubin Total: 0.3 mg/dL (ref 0.0–1.2)
CO2: 20 mmol/L (ref 20–29)
Calcium: 9.5 mg/dL (ref 8.7–10.3)
Chloride: 104 mmol/L (ref 96–106)
Creatinine, Ser: 0.72 mg/dL (ref 0.57–1.00)
Globulin, Total: 2.8 g/dL (ref 1.5–4.5)
Glucose: 95 mg/dL (ref 70–99)
Potassium: 4.5 mmol/L (ref 3.5–5.2)
Sodium: 139 mmol/L (ref 134–144)
Total Protein: 6.9 g/dL (ref 6.0–8.5)
eGFR: 95 mL/min/1.73 (ref >59)

## 2024-02-06 LAB — HEMOGLOBIN A1C
Est. average glucose Bld gHb Est-mCnc: 140 mg/dL
Hgb A1c MFr Bld: 6.5 % — ABNORMAL HIGH (ref 4.8–5.6)

## 2024-02-06 LAB — TSH: TSH: 4.11 u[IU]/mL (ref 0.450–4.500)

## 2024-02-07 ENCOUNTER — Other Ambulatory Visit: Payer: Self-pay | Admitting: Cardiology

## 2024-02-07 DIAGNOSIS — Z1231 Encounter for screening mammogram for malignant neoplasm of breast: Secondary | ICD-10-CM

## 2024-02-09 ENCOUNTER — Encounter: Payer: Self-pay | Admitting: Cardiology

## 2024-02-09 ENCOUNTER — Ambulatory Visit (INDEPENDENT_AMBULATORY_CARE_PROVIDER_SITE_OTHER): Admitting: Cardiology

## 2024-02-09 VITALS — BP 118/72 | HR 79 | Ht 66.0 in | Wt 176.2 lb

## 2024-02-09 DIAGNOSIS — E782 Mixed hyperlipidemia: Secondary | ICD-10-CM

## 2024-02-09 DIAGNOSIS — E039 Hypothyroidism, unspecified: Secondary | ICD-10-CM | POA: Diagnosis not present

## 2024-02-09 DIAGNOSIS — I10 Essential (primary) hypertension: Secondary | ICD-10-CM | POA: Diagnosis not present

## 2024-02-09 DIAGNOSIS — E1165 Type 2 diabetes mellitus with hyperglycemia: Secondary | ICD-10-CM | POA: Diagnosis not present

## 2024-02-09 DIAGNOSIS — G479 Sleep disorder, unspecified: Secondary | ICD-10-CM | POA: Insufficient documentation

## 2024-02-09 NOTE — Assessment & Plan Note (Signed)
 Well controlled today, continue current dose of lisinopril .

## 2024-02-09 NOTE — Progress Notes (Signed)
 Established Patient Office Visit  Subjective:  Patient ID: Lauren Rodriguez, female    DOB: 04-09-1961  Age: 63 y.o. MRN: [id number]  Chief Complaint  Patient presents with   Follow-up    FMLA and sleeping issues    Patient in office for FMLA paperwork, and sleeping issues. Patient complaining of trouble sleeping. Patient reports having trouble sleeping  for the past month. Has been taking new supplements, including fish oil. Recommend magnesium supplement to help with sleep.  FL[employer] paperwork to be filled out.  Discussed recent lab work. LDL elevated, take statin as prescribed.     No other concerns at this time.   Past Medical History:  Diagnosis Date   Controlled type 2 diabetes mellitus without complication, without long-term current use of insulin (HCC) 10/24/2022   Diabetes mellitus without complication (HCC)    Hyperlipidemia    Hypertension    Migraines    Thyroid disease     Past Surgical History:  Procedure Laterality Date   ABDOMINAL HYSTERECTOMY      Social History   Socioeconomic History   Marital status: Single    Spouse name: Not on file   Number of children: 0   Years of education: Not on file   Highest education level: Not on file  Occupational History   Not on file  Tobacco Use   Smoking status: Never   Smokeless tobacco: Never  Vaping Use   Vaping status: Never Used  Substance and Sexual Activity   Alcohol use: Not Currently    Alcohol/week: 2.0 standard drinks of alcohol    Types: 2 Glasses of wine per week   Drug use: Not Currently   Sexual activity: Not Currently  Other Topics Concern   Not on file  Social History Narrative   No caffeine use    Social Drivers of Corporate investment banker Strain: Not on file  Food Insecurity: Not on file  Transportation Needs: Not on file  Physical Activity: Not on file  Stress: Not on file  Social Connections: Not on file  Intimate Partner Violence: Not on file    Family History   Problem Relation Age of Onset   Heart disease Father    Breast cancer Neg Hx     Allergies  Allergen Reactions   Other Hives and Swelling    Chocolate flavoring    Outpatient Medications Prior to Visit  Medication Sig   empagliflozin (JARDIANCE) 25 MG TABS tablet TAKE 1 TABLET BY MOUTH EVERY MORNING   levothyroxine  (SYNTHROID ) 125 MCG tablet Take 1 tablet (125 mcg total) by mouth daily.   lisinopril  (ZESTRIL ) 5 MG tablet Take 1 tablet (5 mg total) by mouth daily.   rosuvastatin  (CRESTOR ) 40 MG tablet TAKE 1 TABLET(40 MG) BY MOUTH DAILY   topiramate  (TOPAMAX ) 25 MG tablet Take 1 tablet (25 mg total) by mouth 2 (two) times daily.   Ubrogepant  (UBRELVY ) 100 MG TABS Take 1 tablet (100 mg total) by mouth as needed. May repeat a dose in 2 hours if headache persists. Max dose 2 pills in 24 hours   No facility-administered medications prior to visit.    Review of Systems  Constitutional: Negative.   HENT: Negative.    Eyes: Negative.   Respiratory: Negative.  Negative for shortness of breath.   Cardiovascular: Negative.  Negative for chest pain.  Gastrointestinal: Negative.  Negative for abdominal pain, constipation and diarrhea.  Genitourinary: Negative.   Musculoskeletal:  Negative for joint pain and myalgias.  Skin: Negative.   Neurological: Negative.  Negative for dizziness and headaches.  Endo/Heme/Allergies: Negative.   Psychiatric/Behavioral:  The patient has insomnia.   All other systems reviewed and are negative.      Objective:   BP 118/72   Pulse 79   Ht 5' 6 (1.676 m)   Wt 176 lb 3.2 oz (79.9 kg)   SpO2 98%   BMI 28.44 kg/m   Vitals:   02/09/24 0905  BP: 118/72  Pulse: 79  Height: 5' 6 (1.676 m)  Weight: 176 lb 3.2 oz (79.9 kg)  SpO2: 98%  BMI (Calculated): 28.45    Physical Exam Vitals and nursing note reviewed.  Constitutional:      Appearance: Normal appearance. She is normal weight.  HENT:     Head: Normocephalic and atraumatic.     Nose:  Nose normal.     Mouth/Throat:     Mouth: Mucous membranes are moist.  Eyes:     Extraocular Movements: Extraocular movements intact.     Conjunctiva/sclera: Conjunctivae normal.     Pupils: Pupils are equal, round, and reactive to light.  Cardiovascular:     Rate and Rhythm: Normal rate and regular rhythm.     Pulses: Normal pulses.     Heart sounds: Normal heart sounds.  Pulmonary:     Effort: Pulmonary effort is normal.     Breath sounds: Normal breath sounds.  Abdominal:     General: Abdomen is flat. Bowel sounds are normal.     Palpations: Abdomen is soft.  Musculoskeletal:        General: Normal range of motion.     Cervical back: Normal range of motion.  Skin:    General: Skin is warm and dry.  Neurological:     General: No focal deficit present.     Mental Status: She is alert and oriented to person, place, and time.  Psychiatric:        Mood and Affect: Mood normal.        Behavior: Behavior normal.        Thought Content: Thought content normal.        Judgment: Judgment normal.      No results found for any visits on 02/09/24.  Recent Results (from the past 2160 hours)  CMP14+EGFR     Status: None   Collection Time: 12/14/23  3:36 PM  Result Value Ref Range   Glucose 93 70 - 99 mg/dL   BUN 19 8 - 27 mg/dL   Creatinine, Ser 9.32 0.57 - 1.00 mg/dL   eGFR 99 >40 fO/fpw/8.26   BUN/Creatinine Ratio 28 12 - 28   Sodium 137 134 - 144 mmol/L   Potassium 4.5 3.5 - 5.2 mmol/L   Chloride 100 96 - 106 mmol/L   CO2 20 20 - 29 mmol/L   Calcium  9.5 8.7 - 10.3 mg/dL   Total Protein 7.0 6.0 - 8.5 g/dL   Albumin 4.2 3.9 - 4.9 g/dL   Globulin, Total 2.8 1.5 - 4.5 g/dL   Bilirubin Total 0.3 0.0 - 1.2 mg/dL   Alkaline Phosphatase 95 44 - 121 IU/L   AST 17 0 - 40 IU/L   ALT 11 0 - 32 IU/L  TSH     Status: None   Collection Time: 12/14/23  3:36 PM  Result Value Ref Range   TSH 3.220 0.450 - 4.500 uIU/mL  Hemoglobin A1c     Status: Abnormal   Collection Time: 02/05/24   7:58 AM  Result Value Ref Range   Hgb A1c MFr Bld 6.5 (H) 4.8 - 5.6 %    Comment:          Prediabetes: 5.7 - 6.4          Diabetes: >6.4          Glycemic control for adults with diabetes: <7.0    Est. average glucose Bld gHb Est-mCnc 140 mg/dL  TSH     Status: None   Collection Time: 02/05/24  7:58 AM  Result Value Ref Range   TSH 4.110 0.450 - 4.500 uIU/mL  CMP14+EGFR     Status: Abnormal   Collection Time: 02/05/24  7:58 AM  Result Value Ref Range   Glucose 95 70 - 99 mg/dL   BUN 29 (H) 8 - 27 mg/dL   Creatinine, Ser 9.27 0.57 - 1.00 mg/dL   eGFR 95 >40 fO/fpw/8.26   BUN/Creatinine Ratio 40 (H) 12 - 28   Sodium 139 134 - 144 mmol/L   Potassium 4.5 3.5 - 5.2 mmol/L   Chloride 104 96 - 106 mmol/L   CO2 20 20 - 29 mmol/L   Calcium  9.5 8.7 - 10.3 mg/dL   Total Protein 6.9 6.0 - 8.5 g/dL   Albumin 4.1 3.9 - 4.9 g/dL   Globulin, Total 2.8 1.5 - 4.5 g/dL   Bilirubin Total 0.3 0.0 - 1.2 mg/dL   Alkaline Phosphatase 90 49 - 135 IU/L   AST 17 0 - 40 IU/L   ALT 12 0 - 32 IU/L  Lipid Profile     Status: Abnormal   Collection Time: 02/05/24  7:58 AM  Result Value Ref Range   Cholesterol, Total 240 (H) 100 - 199 mg/dL   Triglycerides 90 0 - 149 mg/dL   HDL 58 >60 mg/dL   VLDL Cholesterol Cal 16 5 - 40 mg/dL   LDL Chol Calc (NIH) 833 (H) 0 - 99 mg/dL   Chol/HDL Ratio 4.1 0.0 - 4.4 ratio    Comment:                                   T. Chol/HDL Ratio                                             Men  Women                               1/2 Avg.Risk  3.4    3.3                                   Avg.Risk  5.0    4.4                                2X Avg.Risk  9.6    7.1                                3X Avg.Risk 23.4   11.0       Assessment & Plan:   Problem List Items Addressed This Visit  Cardiovascular and Mediastinum   Primary hypertension - Primary   Well controlled today, continue current dose of lisinopril .         Endocrine   Acquired hypothyroidism    Recent TSH within normal limits, continue current dose of levothyroxine .       Type 2 diabetes mellitus with hyperglycemia, without long-term current use of insulin (HCC)   Recent Hgb A1c stable, continue same dose of Jardiance. Normal kidney function.         Other   Mixed hyperlipidemia   Recent LDL elevated. Patient admits to being non-compliant with her statin. Recommend taking statin daily. Normal liver enzymes.       Trouble in sleeping   Recommend OTC magnesium to help with sleep.        Return if symptoms worsen or fail to improve, for as scheduled.   Total time spent: 25 minutes. This time includes review of previous notes and results and patient face to face interaction during today's visit.    Jeoffrey Pollen, NP  02/09/2024   This document may have been prepared by Dragon Voice Recognition software and as such may include unintentional dictation errors.

## 2024-02-09 NOTE — Assessment & Plan Note (Addendum)
 Recent Hgb A1c stable, continue same dose of Jardiance. Normal kidney function.

## 2024-02-09 NOTE — Assessment & Plan Note (Signed)
 Recent LDL elevated. Patient admits to being non-compliant with her statin. Recommend taking statin daily. Normal liver enzymes.

## 2024-02-09 NOTE — Assessment & Plan Note (Signed)
 Recommend OTC magnesium to help with sleep.

## 2024-02-09 NOTE — Assessment & Plan Note (Signed)
 Recent TSH within normal limits, continue current dose of levothyroxine .

## 2024-02-16 ENCOUNTER — Other Ambulatory Visit: Payer: Self-pay | Admitting: Cardiology

## 2024-03-05 ENCOUNTER — Ambulatory Visit
Admission: RE | Admit: 2024-03-05 | Discharge: 2024-03-05 | Disposition: A | Discharge status: Home / Self Care | Source: Ambulatory Visit | Attending: Cardiology | Admitting: Cardiology

## 2024-03-05 DIAGNOSIS — Z1231 Encounter for screening mammogram for malignant neoplasm of breast: Secondary | ICD-10-CM | POA: Insufficient documentation

## 2024-03-27 ENCOUNTER — Other Ambulatory Visit: Payer: Self-pay | Admitting: Cardiology

## 2024-04-22 ENCOUNTER — Ambulatory Visit: Admitting: Cardiology

## 2024-04-22 ENCOUNTER — Other Ambulatory Visit

## 2024-04-22 DIAGNOSIS — I10 Essential (primary) hypertension: Secondary | ICD-10-CM

## 2024-04-22 DIAGNOSIS — E039 Hypothyroidism, unspecified: Secondary | ICD-10-CM

## 2024-04-22 DIAGNOSIS — E782 Mixed hyperlipidemia: Secondary | ICD-10-CM

## 2024-04-22 DIAGNOSIS — E119 Type 2 diabetes mellitus without complications: Secondary | ICD-10-CM

## 2024-04-23 ENCOUNTER — Ambulatory Visit: Payer: Self-pay | Admitting: Cardiology

## 2024-04-23 LAB — LIPID PANEL
Chol/HDL Ratio: 2.5 ratio (ref 0.0–4.4)
Cholesterol, Total: 160 mg/dL (ref 100–199)
HDL: 64 mg/dL
LDL Chol Calc (NIH): 85 mg/dL (ref 0–99)
Triglycerides: 52 mg/dL (ref 0–149)
VLDL Cholesterol Cal: 11 mg/dL (ref 5–40)

## 2024-04-23 LAB — HEMOGLOBIN A1C
Est. average glucose Bld gHb Est-mCnc: 137 mg/dL
Hgb A1c MFr Bld: 6.4 % — ABNORMAL HIGH (ref 4.8–5.6)

## 2024-04-23 LAB — CMP14+EGFR
ALT: 14 IU/L (ref 0–32)
AST: 17 IU/L (ref 0–40)
Albumin: 4.1 g/dL (ref 3.9–4.9)
Alkaline Phosphatase: 89 IU/L (ref 49–135)
BUN/Creatinine Ratio: 33 — ABNORMAL HIGH (ref 12–28)
BUN: 20 mg/dL (ref 8–27)
Bilirubin Total: 0.4 mg/dL (ref 0.0–1.2)
CO2: 23 mmol/L (ref 20–29)
Calcium: 9.5 mg/dL (ref 8.7–10.3)
Chloride: 104 mmol/L (ref 96–106)
Creatinine, Ser: 0.61 mg/dL (ref 0.57–1.00)
Globulin, Total: 2.3 g/dL (ref 1.5–4.5)
Glucose: 106 mg/dL — ABNORMAL HIGH (ref 70–99)
Potassium: 4.2 mmol/L (ref 3.5–5.2)
Sodium: 140 mmol/L (ref 134–144)
Total Protein: 6.4 g/dL (ref 6.0–8.5)
eGFR: 101 mL/min/1.73

## 2024-04-23 LAB — TSH: TSH: 4.32 u[IU]/mL (ref 0.450–4.500)

## 2024-04-25 ENCOUNTER — Encounter: Payer: Self-pay | Admitting: Cardiology

## 2024-04-25 ENCOUNTER — Ambulatory Visit (INDEPENDENT_AMBULATORY_CARE_PROVIDER_SITE_OTHER): Admitting: Cardiology

## 2024-04-25 ENCOUNTER — Ambulatory Visit: Payer: Self-pay | Admitting: Cardiology

## 2024-04-25 VITALS — BP 122/68 | HR 75 | Ht 66.0 in | Wt 177.0 lb

## 2024-04-25 DIAGNOSIS — E039 Hypothyroidism, unspecified: Secondary | ICD-10-CM | POA: Diagnosis not present

## 2024-04-25 DIAGNOSIS — R5383 Other fatigue: Secondary | ICD-10-CM | POA: Insufficient documentation

## 2024-04-25 DIAGNOSIS — E1159 Type 2 diabetes mellitus with other circulatory complications: Secondary | ICD-10-CM | POA: Diagnosis not present

## 2024-04-25 DIAGNOSIS — E1165 Type 2 diabetes mellitus with hyperglycemia: Secondary | ICD-10-CM | POA: Diagnosis not present

## 2024-04-25 DIAGNOSIS — I152 Hypertension secondary to endocrine disorders: Secondary | ICD-10-CM

## 2024-04-25 DIAGNOSIS — E1169 Type 2 diabetes mellitus with other specified complication: Secondary | ICD-10-CM | POA: Diagnosis not present

## 2024-04-25 DIAGNOSIS — E782 Mixed hyperlipidemia: Secondary | ICD-10-CM

## 2024-04-25 LAB — POCT UA - MICROALBUMIN
Albumin/Creatinine Ratio, Urine, POC: >300
Creatinine, POC: 100 mg/dL
Microalbumin Ur, POC: 150 mg/L

## 2024-04-25 NOTE — Progress Notes (Signed)
 "  Established Patient Office Visit  Subjective:  Patient ID: Lauren Rodriguez, female    DOB: Oct 02, 1960  Age: 64 y.o. MRN: [id number]  Chief Complaint  Patient presents with   Follow-up    4 months follow up    Patient in office for 4 month follow up, discuss recent lab results. Patient doing well, complains of fatigue.  Discussed recent lab work. TSH controlled. LDL improved. A1c stable. Normal kidney function. Will check a vitamin B12, vitamin D , CBC and iron.  Due for urine micro. Will do today.  Continue same medications.     No other concerns at this time.   Past Medical History:  Diagnosis Date   Controlled type 2 diabetes mellitus without complication, without long-term current use of insulin (HCC) 10/24/2022   Diabetes mellitus without complication (HCC)    Hyperlipidemia    Hypertension    Migraines    Thyroid disease     Past Surgical History:  Procedure Laterality Date   ABDOMINAL HYSTERECTOMY      Social History   Socioeconomic History   Marital status: Single    Spouse name: Not on file   Number of children: 0   Years of education: Not on file   Highest education level: Not on file  Occupational History   Not on file  Tobacco Use   Smoking status: Never   Smokeless tobacco: Never  Vaping Use   Vaping status: Never Used  Substance and Sexual Activity   Alcohol use: Not Currently    Alcohol/week: 2.0 standard drinks of alcohol    Types: 2 Glasses of wine per week   Drug use: Not Currently   Sexual activity: Not Currently  Other Topics Concern   Not on file  Social History Narrative   No caffeine use    Social Drivers of Health   Tobacco Use: Low Risk (04/25/2024)   Patient History    Smoking Tobacco Use: Never    Smokeless Tobacco Use: Never    Passive Exposure: Not on file  Financial Resource Strain: Not on file  Food Insecurity: Not on file  Transportation Needs: Not on file  Physical Activity: Not on file  Stress: Not on file   Social Connections: Not on file  Intimate Partner Violence: Not on file  Depression (PHQ2-9): Low Risk (08/08/2023)   Depression (PHQ2-9)    PHQ-2 Score: 0  Alcohol Screen: Not on file  Housing: Not on file  Utilities: Not on file  Health Literacy: Not on file    Family History  Problem Relation Age of Onset   Heart disease Father    Breast cancer Neg Hx     Allergies[1]  Show/hide medication list[2]  Review of Systems  Constitutional: Negative.   HENT: Negative.    Eyes: Negative.   Respiratory: Negative.  Negative for shortness of breath.   Cardiovascular: Negative.  Negative for chest pain.  Gastrointestinal: Negative.  Negative for abdominal pain, constipation and diarrhea.  Genitourinary: Negative.   Musculoskeletal:  Negative for joint pain and myalgias.  Skin: Negative.   Neurological: Negative.  Negative for dizziness and headaches.  Endo/Heme/Allergies: Negative.   All other systems reviewed and are negative.      Objective:   BP 122/68   Pulse 75   Ht 5' 6 (1.676 m)   Wt 177 lb (80.3 kg)   SpO2 97%   BMI 28.57 kg/m   Vitals:   04/25/24 0939  BP: 122/68  Pulse: 75  Height:  5' 6 (1.676 m)  Weight: 177 lb (80.3 kg)  SpO2: 97%  BMI (Calculated): 28.58    Physical Exam Vitals and nursing note reviewed.  Constitutional:      Appearance: Normal appearance. She is normal weight.  HENT:     Head: Normocephalic and atraumatic.     Nose: Nose normal.     Mouth/Throat:     Mouth: Mucous membranes are moist.  Eyes:     Extraocular Movements: Extraocular movements intact.     Conjunctiva/sclera: Conjunctivae normal.     Pupils: Pupils are equal, round, and reactive to light.  Cardiovascular:     Rate and Rhythm: Normal rate and regular rhythm.     Pulses: Normal pulses.     Heart sounds: Normal heart sounds.  Pulmonary:     Effort: Pulmonary effort is normal.     Breath sounds: Normal breath sounds.  Abdominal:     General: Abdomen is  flat. Bowel sounds are normal.     Palpations: Abdomen is soft.  Musculoskeletal:        General: Normal range of motion.     Cervical back: Normal range of motion.  Skin:    General: Skin is warm and dry.  Neurological:     General: No focal deficit present.     Mental Status: She is alert and oriented to person, place, and time.  Psychiatric:        Mood and Affect: Mood normal.        Behavior: Behavior normal.        Thought Content: Thought content normal.        Judgment: Judgment normal.      No results found for any visits on 04/25/24.  Recent Results (from the past 2160 hours)  Hemoglobin A1c     Status: Abnormal   Collection Time: 02/05/24  7:58 AM  Result Value Ref Range   Hgb A1c MFr Bld 6.5 (H) 4.8 - 5.6 %    Comment:          Prediabetes: 5.7 - 6.4          Diabetes: >6.4          Glycemic control for adults with diabetes: <7.0    Est. average glucose Bld gHb Est-mCnc 140 mg/dL  TSH     Status: None   Collection Time: 02/05/24  7:58 AM  Result Value Ref Range   TSH 4.110 0.450 - 4.500 uIU/mL  CMP14+EGFR     Status: Abnormal   Collection Time: 02/05/24  7:58 AM  Result Value Ref Range   Glucose 95 70 - 99 mg/dL   BUN 29 (H) 8 - 27 mg/dL   Creatinine, Ser 9.27 0.57 - 1.00 mg/dL   eGFR 95 >40 fO/fpw/8.26   BUN/Creatinine Ratio 40 (H) 12 - 28   Sodium 139 134 - 144 mmol/L   Potassium 4.5 3.5 - 5.2 mmol/L   Chloride 104 96 - 106 mmol/L   CO2 20 20 - 29 mmol/L   Calcium  9.5 8.7 - 10.3 mg/dL   Total Protein 6.9 6.0 - 8.5 g/dL   Albumin 4.1 3.9 - 4.9 g/dL   Globulin, Total 2.8 1.5 - 4.5 g/dL   Bilirubin Total 0.3 0.0 - 1.2 mg/dL   Alkaline Phosphatase 90 49 - 135 IU/L   AST 17 0 - 40 IU/L   ALT 12 0 - 32 IU/L  Lipid Profile     Status: Abnormal   Collection Time: 02/05/24  7:58 AM  Result Value Ref Range   Cholesterol, Total 240 (H) 100 - 199 mg/dL   Triglycerides 90 0 - 149 mg/dL   HDL 58 >60 mg/dL   VLDL Cholesterol Cal 16 5 - 40 mg/dL   LDL Chol  Calc (NIH) 166 (H) 0 - 99 mg/dL   Chol/HDL Ratio 4.1 0.0 - 4.4 ratio    Comment:                                   T. Chol/HDL Ratio                                             Men  Women                               1/2 Avg.Risk  3.4    3.3                                   Avg.Risk  5.0    4.4                                2X Avg.Risk  9.6    7.1                                3X Avg.Risk 23.4   11.0   Hemoglobin A1c     Status: Abnormal   Collection Time: 04/22/24  9:06 AM  Result Value Ref Range   Hgb A1c MFr Bld 6.4 (H) 4.8 - 5.6 %    Comment:          Prediabetes: 5.7 - 6.4          Diabetes: >6.4          Glycemic control for adults with diabetes: <7.0    Est. average glucose Bld gHb Est-mCnc 137 mg/dL  TSH     Status: None   Collection Time: 04/22/24  9:06 AM  Result Value Ref Range   TSH 4.320 0.450 - 4.500 uIU/mL  CMP14+EGFR     Status: Abnormal   Collection Time: 04/22/24  9:06 AM  Result Value Ref Range   Glucose 106 (H) 70 - 99 mg/dL   BUN 20 8 - 27 mg/dL   Creatinine, Ser 9.38 0.57 - 1.00 mg/dL   eGFR 898 >40 fO/fpw/8.26   BUN/Creatinine Ratio 33 (H) 12 - 28   Sodium 140 134 - 144 mmol/L   Potassium 4.2 3.5 - 5.2 mmol/L   Chloride 104 96 - 106 mmol/L   CO2 23 20 - 29 mmol/L   Calcium  9.5 8.7 - 10.3 mg/dL   Total Protein 6.4 6.0 - 8.5 g/dL   Albumin 4.1 3.9 - 4.9 g/dL   Globulin, Total 2.3 1.5 - 4.5 g/dL   Bilirubin Total 0.4 0.0 - 1.2 mg/dL   Alkaline Phosphatase 89 49 - 135 IU/L   AST 17 0 - 40 IU/L   ALT 14 0 - 32 IU/L  Lipid Profile     Status: None   Collection Time:  04/22/24  9:06 AM  Result Value Ref Range   Cholesterol, Total 160 100 - 199 mg/dL   Triglycerides 52 0 - 149 mg/dL   HDL 64 >60 mg/dL   VLDL Cholesterol Cal 11 5 - 40 mg/dL   LDL Chol Calc (NIH) 85 0 - 99 mg/dL   Chol/HDL Ratio 2.5 0.0 - 4.4 ratio    Comment:                                   T. Chol/HDL Ratio                                             Men  Women                                1/2 Avg.Risk  3.4    3.3                                   Avg.Risk  5.0    4.4                                2X Avg.Risk  9.6    7.1                                3X Avg.Risk 23.4   11.0       Assessment & Plan:  Labs today Urine micro today Continue same medications  Problem List Items Addressed This Visit       Cardiovascular and Mediastinum   Hypertension associated with diabetes (HCC)   Relevant Orders   POCT Urine Albumin/Creatinine with ratio [[id number]]   CBC with Differential/Platelet   Iron, TIBC and Ferritin Panel   Vitamin D  (25 hydroxy)   Vitamin B12     Endocrine   Acquired hypothyroidism   Relevant Orders   CBC with Differential/Platelet   Iron, TIBC and Ferritin Panel   Vitamin D  (25 hydroxy)   Vitamin B12   Type 2 diabetes mellitus with hyperglycemia, without long-term current use of insulin (HCC)   Relevant Orders   CBC with Differential/Platelet   Iron, TIBC and Ferritin Panel   Vitamin D  (25 hydroxy)   Vitamin B12   Combined hyperlipidemia associated with type 2 diabetes mellitus (HCC) - Primary   Relevant Orders   CBC with Differential/Platelet   Iron, TIBC and Ferritin Panel   Vitamin D  (25 hydroxy)   Vitamin B12     Other   Other fatigue   Relevant Orders   CBC with Differential/Platelet   Iron, TIBC and Ferritin Panel   Vitamin D  (25 hydroxy)   Vitamin B12    Return in about 4 months (around 08/23/2024) for fasting lab work prior.   Total time spent: 25 minutes. This time includes review of previous notes and results and patient face to face interaction during today's visit.    Jeoffrey Pollen, NP  04/25/2024   This document may have been prepared by [facility] Voice Recognition software and as such may include unintentional dictation errors.     [  1]  Allergies Allergen Reactions   Other Hives and Swelling    Chocolate flavoring  [2]  Outpatient Medications Prior to Visit  Medication Sig   JARDIANCE 25 MG TABS  tablet TAKE 1 TABLET BY MOUTH EVERY MORNING   levothyroxine  (SYNTHROID ) 125 MCG tablet Take 1 tablet (125 mcg total) by mouth daily.   lisinopril  (ZESTRIL ) 5 MG tablet TAKE 1 TABLET(5 MG) BY MOUTH DAILY   rosuvastatin  (CRESTOR ) 40 MG tablet TAKE 1 TABLET(40 MG) BY MOUTH DAILY   topiramate  (TOPAMAX ) 25 MG tablet Take 1 tablet (25 mg total) by mouth 2 (two) times daily.   Ubrogepant  (UBRELVY ) 100 MG TABS Take 1 tablet (100 mg total) by mouth as needed. May repeat a dose in 2 hours if headache persists. Max dose 2 pills in 24 hours   No facility-administered medications prior to visit.   "

## 2024-04-26 LAB — CBC WITH DIFFERENTIAL/PLATELET
Basophils Absolute: 0 x10E3/uL (ref 0.0–0.2)
Basos: 1 %
EOS (ABSOLUTE): 0.2 x10E3/uL (ref 0.0–0.4)
Eos: 3 %
Hematocrit: 38 % (ref 34.0–46.6)
Hemoglobin: 12.1 g/dL (ref 11.1–15.9)
Immature Grans (Abs): 0 x10E3/uL (ref 0.0–0.1)
Immature Granulocytes: 0 %
Lymphocytes Absolute: 1.8 x10E3/uL (ref 0.7–3.1)
Lymphs: 38 %
MCH: 29.2 pg (ref 26.6–33.0)
MCHC: 31.8 g/dL (ref 31.5–35.7)
MCV: 92 fL (ref 79–97)
Monocytes Absolute: 0.4 x10E3/uL (ref 0.1–0.9)
Monocytes: 9 %
Neutrophils Absolute: 2.4 x10E3/uL (ref 1.4–7.0)
Neutrophils: 49 %
Platelets: 323 x10E3/uL (ref 150–450)
RBC: 4.15 x10E6/uL (ref 3.77–5.28)
RDW: 12.7 % (ref 11.7–15.4)
WBC: 4.8 x10E3/uL (ref 3.4–10.8)

## 2024-04-26 LAB — IRON,TIBC AND FERRITIN PANEL
Ferritin: 129 ng/mL (ref 15–150)
Iron Saturation: 33 % (ref 15–55)
Iron: 98 ug/dL (ref 27–139)
Total Iron Binding Capacity: 296 ug/dL (ref 250–450)
UIBC: 198 ug/dL (ref 118–369)

## 2024-04-26 LAB — VITAMIN B12: Vitamin B-12: 1151 pg/mL (ref 232–1245)

## 2024-04-26 LAB — VITAMIN D 25 HYDROXY (VIT D DEFICIENCY, FRACTURES): Vit D, 25-Hydroxy: 33.2 ng/mL (ref 30.0–100.0)

## 2024-07-03 ENCOUNTER — Ambulatory Visit: Admitting: Adult Health
